# Patient Record
Sex: Female | Born: 1970 | Race: Black or African American | Hispanic: No | Marital: Single | Smoking: Never smoker
Health system: Southern US, Community
[De-identification: ages and names within clinical notes are randomized; demographics above are authoritative.]

## PROBLEM LIST (undated history)

## (undated) DIAGNOSIS — T7840XA Allergy, unspecified, initial encounter: Secondary | ICD-10-CM

## (undated) DIAGNOSIS — R0989 Other specified symptoms and signs involving the circulatory and respiratory systems: Secondary | ICD-10-CM

## (undated) DIAGNOSIS — J301 Allergic rhinitis due to pollen: Secondary | ICD-10-CM

## (undated) DIAGNOSIS — E119 Type 2 diabetes mellitus without complications: Secondary | ICD-10-CM

## (undated) HISTORY — DX: Allergic rhinitis due to pollen: J30.1

## (undated) HISTORY — DX: Allergy, unspecified, initial encounter: T78.40XA

## (undated) HISTORY — DX: Type 2 diabetes mellitus without complications: E11.9

## (undated) HISTORY — DX: Other specified symptoms and signs involving the circulatory and respiratory systems: R09.89

## (undated) HISTORY — PX: CARPAL TUNNEL WITH CUBITAL TUNNEL: SHX5608

---

## 1998-05-19 ENCOUNTER — Emergency Department (HOSPITAL_COMMUNITY): Admission: EM | Admit: 1998-05-19 | Discharge: 1998-05-19 | Payer: Self-pay | Admitting: Emergency Medicine

## 1998-06-26 ENCOUNTER — Encounter: Admission: RE | Admit: 1998-06-26 | Discharge: 1998-06-26 | Payer: Self-pay | Admitting: Family Medicine

## 1998-07-30 ENCOUNTER — Encounter: Admission: RE | Admit: 1998-07-30 | Discharge: 1998-07-30 | Payer: Self-pay | Admitting: Family Medicine

## 1998-07-30 ENCOUNTER — Other Ambulatory Visit: Admission: RE | Admit: 1998-07-30 | Discharge: 1998-07-30 | Payer: Self-pay | Admitting: Emergency Medicine

## 1998-09-03 ENCOUNTER — Encounter: Admission: RE | Admit: 1998-09-03 | Discharge: 1998-09-03 | Payer: Self-pay | Admitting: Family Medicine

## 1998-10-03 ENCOUNTER — Encounter: Admission: RE | Admit: 1998-10-03 | Discharge: 1998-10-03 | Payer: Self-pay | Admitting: Family Medicine

## 1998-12-12 ENCOUNTER — Encounter: Admission: RE | Admit: 1998-12-12 | Discharge: 1998-12-12 | Payer: Self-pay | Admitting: Sports Medicine

## 1999-01-06 ENCOUNTER — Encounter: Admission: RE | Admit: 1999-01-06 | Discharge: 1999-01-06 | Payer: Self-pay | Admitting: Sports Medicine

## 1999-02-02 ENCOUNTER — Encounter: Admission: RE | Admit: 1999-02-02 | Discharge: 1999-02-02 | Payer: Self-pay | Admitting: Family Medicine

## 1999-02-05 ENCOUNTER — Encounter: Admission: RE | Admit: 1999-02-05 | Discharge: 1999-02-05 | Payer: Self-pay | Admitting: Family Medicine

## 1999-02-05 ENCOUNTER — Ambulatory Visit (HOSPITAL_COMMUNITY): Admission: RE | Admit: 1999-02-05 | Discharge: 1999-02-05 | Payer: Self-pay | Admitting: Family Medicine

## 1999-02-19 ENCOUNTER — Encounter: Admission: RE | Admit: 1999-02-19 | Discharge: 1999-02-19 | Payer: Self-pay | Admitting: Family Medicine

## 1999-03-09 ENCOUNTER — Encounter: Admission: RE | Admit: 1999-03-09 | Discharge: 1999-03-09 | Payer: Self-pay | Admitting: Family Medicine

## 2003-02-06 ENCOUNTER — Encounter: Admission: RE | Admit: 2003-02-06 | Discharge: 2003-02-06 | Payer: Self-pay | Admitting: Family Medicine

## 2003-02-06 ENCOUNTER — Other Ambulatory Visit: Admission: RE | Admit: 2003-02-06 | Discharge: 2003-02-06 | Payer: Self-pay | Admitting: Family Medicine

## 2003-08-12 ENCOUNTER — Encounter: Admission: RE | Admit: 2003-08-12 | Discharge: 2003-08-12 | Payer: Self-pay | Admitting: Family Medicine

## 2004-03-25 ENCOUNTER — Encounter (INDEPENDENT_AMBULATORY_CARE_PROVIDER_SITE_OTHER): Payer: Self-pay | Admitting: *Deleted

## 2004-03-25 LAB — CONVERTED CEMR LAB

## 2004-04-13 ENCOUNTER — Ambulatory Visit: Payer: Self-pay | Admitting: Family Medicine

## 2004-04-24 ENCOUNTER — Other Ambulatory Visit: Admission: RE | Admit: 2004-04-24 | Discharge: 2004-04-24 | Payer: Self-pay | Admitting: Family Medicine

## 2004-04-24 ENCOUNTER — Ambulatory Visit: Payer: Self-pay | Admitting: Sports Medicine

## 2004-08-07 ENCOUNTER — Ambulatory Visit: Payer: Self-pay | Admitting: Family Medicine

## 2004-09-11 ENCOUNTER — Ambulatory Visit: Payer: Self-pay | Admitting: Family Medicine

## 2005-01-10 ENCOUNTER — Emergency Department (HOSPITAL_COMMUNITY): Admission: EM | Admit: 2005-01-10 | Discharge: 2005-01-10 | Payer: Self-pay | Admitting: Emergency Medicine

## 2005-01-12 ENCOUNTER — Ambulatory Visit: Payer: Self-pay | Admitting: Sports Medicine

## 2005-03-04 ENCOUNTER — Ambulatory Visit: Payer: Self-pay | Admitting: Family Medicine

## 2005-04-17 ENCOUNTER — Emergency Department (HOSPITAL_COMMUNITY): Admission: EM | Admit: 2005-04-17 | Discharge: 2005-04-17 | Payer: Self-pay | Admitting: Emergency Medicine

## 2005-10-06 ENCOUNTER — Encounter: Admission: RE | Admit: 2005-10-06 | Discharge: 2005-10-06 | Payer: Self-pay | Admitting: Sports Medicine

## 2005-10-06 ENCOUNTER — Other Ambulatory Visit: Admission: RE | Admit: 2005-10-06 | Discharge: 2005-10-06 | Payer: Self-pay | Admitting: Family Medicine

## 2005-10-06 ENCOUNTER — Ambulatory Visit: Payer: Self-pay | Admitting: Sports Medicine

## 2006-03-24 DIAGNOSIS — E669 Obesity, unspecified: Secondary | ICD-10-CM | POA: Insufficient documentation

## 2006-03-24 DIAGNOSIS — J4540 Moderate persistent asthma, uncomplicated: Secondary | ICD-10-CM | POA: Insufficient documentation

## 2006-03-24 DIAGNOSIS — J309 Allergic rhinitis, unspecified: Secondary | ICD-10-CM | POA: Insufficient documentation

## 2006-03-25 ENCOUNTER — Encounter (INDEPENDENT_AMBULATORY_CARE_PROVIDER_SITE_OTHER): Payer: Self-pay | Admitting: *Deleted

## 2006-07-01 ENCOUNTER — Encounter (INDEPENDENT_AMBULATORY_CARE_PROVIDER_SITE_OTHER): Payer: Self-pay | Admitting: *Deleted

## 2006-08-02 ENCOUNTER — Ambulatory Visit: Payer: Self-pay | Admitting: Family Medicine

## 2006-08-02 ENCOUNTER — Encounter: Payer: Self-pay | Admitting: Family Medicine

## 2006-08-26 ENCOUNTER — Encounter: Admission: RE | Admit: 2006-08-26 | Discharge: 2006-08-26 | Payer: Self-pay | Admitting: Sports Medicine

## 2006-08-30 ENCOUNTER — Ambulatory Visit: Payer: Self-pay | Admitting: Family Medicine

## 2006-11-20 ENCOUNTER — Emergency Department (HOSPITAL_COMMUNITY): Admission: EM | Admit: 2006-11-20 | Discharge: 2006-11-20 | Payer: Self-pay | Admitting: Emergency Medicine

## 2006-12-08 ENCOUNTER — Telehealth: Payer: Self-pay | Admitting: Family Medicine

## 2006-12-15 ENCOUNTER — Encounter: Payer: Self-pay | Admitting: *Deleted

## 2006-12-31 ENCOUNTER — Emergency Department (HOSPITAL_COMMUNITY): Admission: EM | Admit: 2006-12-31 | Discharge: 2006-12-31 | Payer: Self-pay | Admitting: Emergency Medicine

## 2007-03-28 ENCOUNTER — Encounter: Payer: Self-pay | Admitting: Family Medicine

## 2007-03-28 ENCOUNTER — Ambulatory Visit: Payer: Self-pay | Admitting: Family Medicine

## 2007-03-28 ENCOUNTER — Other Ambulatory Visit: Admission: RE | Admit: 2007-03-28 | Discharge: 2007-03-28 | Payer: Self-pay | Admitting: Family Medicine

## 2007-04-03 ENCOUNTER — Ambulatory Visit: Payer: Self-pay | Admitting: Family Medicine

## 2007-04-05 ENCOUNTER — Encounter: Payer: Self-pay | Admitting: Family Medicine

## 2007-04-06 ENCOUNTER — Ambulatory Visit: Payer: Self-pay | Admitting: Family Medicine

## 2007-05-23 ENCOUNTER — Encounter: Payer: Self-pay | Admitting: Family Medicine

## 2007-05-23 ENCOUNTER — Ambulatory Visit: Payer: Self-pay | Admitting: Family Medicine

## 2007-05-23 LAB — CONVERTED CEMR LAB
Chlamydia, DNA Probe: NEGATIVE
GC Probe Amp, Genital: NEGATIVE
Whiff Test: NEGATIVE

## 2007-05-26 ENCOUNTER — Encounter: Payer: Self-pay | Admitting: Family Medicine

## 2007-06-26 ENCOUNTER — Ambulatory Visit: Payer: Self-pay | Admitting: Family Medicine

## 2007-06-29 ENCOUNTER — Telehealth: Payer: Self-pay | Admitting: *Deleted

## 2008-04-25 ENCOUNTER — Encounter: Payer: Self-pay | Admitting: Family Medicine

## 2008-04-25 ENCOUNTER — Ambulatory Visit: Payer: Self-pay | Admitting: Family Medicine

## 2008-04-25 ENCOUNTER — Other Ambulatory Visit: Admission: RE | Admit: 2008-04-25 | Discharge: 2008-04-25 | Payer: Self-pay | Admitting: *Deleted

## 2008-04-25 DIAGNOSIS — L708 Other acne: Secondary | ICD-10-CM | POA: Insufficient documentation

## 2008-04-25 LAB — CONVERTED CEMR LAB: GC Probe Amp, Genital: NEGATIVE

## 2008-04-29 ENCOUNTER — Encounter: Payer: Self-pay | Admitting: Family Medicine

## 2008-05-22 ENCOUNTER — Encounter: Payer: Self-pay | Admitting: Family Medicine

## 2008-08-15 ENCOUNTER — Telehealth: Payer: Self-pay | Admitting: Family Medicine

## 2008-08-28 ENCOUNTER — Encounter: Payer: Self-pay | Admitting: Family Medicine

## 2008-11-01 ENCOUNTER — Ambulatory Visit: Payer: Self-pay | Admitting: Family Medicine

## 2008-11-01 ENCOUNTER — Telehealth: Payer: Self-pay | Admitting: *Deleted

## 2008-12-03 ENCOUNTER — Ambulatory Visit: Payer: Self-pay | Admitting: Family Medicine

## 2009-01-25 HISTORY — PX: CARPAL TUNNEL WITH CUBITAL TUNNEL: SHX5608

## 2009-02-17 ENCOUNTER — Telehealth (INDEPENDENT_AMBULATORY_CARE_PROVIDER_SITE_OTHER): Payer: Self-pay | Admitting: *Deleted

## 2009-02-25 ENCOUNTER — Ambulatory Visit: Payer: Self-pay | Admitting: Family Medicine

## 2009-03-26 ENCOUNTER — Ambulatory Visit: Payer: Self-pay | Admitting: Family Medicine

## 2009-03-28 ENCOUNTER — Ambulatory Visit: Payer: Self-pay | Admitting: Family Medicine

## 2009-05-20 ENCOUNTER — Other Ambulatory Visit: Admission: RE | Admit: 2009-05-20 | Discharge: 2009-05-20 | Payer: Self-pay | Admitting: Family Medicine

## 2009-05-20 ENCOUNTER — Ambulatory Visit: Payer: Self-pay | Admitting: Family Medicine

## 2009-05-20 LAB — CONVERTED CEMR LAB: H Pylori IgG: POSITIVE

## 2009-05-22 ENCOUNTER — Encounter: Payer: Self-pay | Admitting: Family Medicine

## 2009-05-29 ENCOUNTER — Encounter: Payer: Self-pay | Admitting: Family Medicine

## 2009-05-29 ENCOUNTER — Ambulatory Visit: Payer: Self-pay | Admitting: Family Medicine

## 2009-05-29 LAB — CONVERTED CEMR LAB
Albumin: 4.1 g/dL (ref 3.5–5.2)
Alkaline Phosphatase: 91 units/L (ref 39–117)
BUN: 13 mg/dL (ref 6–23)
Calcium: 9.1 mg/dL (ref 8.4–10.5)
Cholesterol: 161 mg/dL (ref 0–200)
HDL: 42 mg/dL (ref >39)
LDL Cholesterol: 107 mg/dL — ABNORMAL HIGH (ref 0–99)
Potassium: 4.5 meq/L (ref 3.5–5.3)
Sodium: 140 meq/L (ref 135–145)
Total Bilirubin: 0.7 mg/dL (ref 0.3–1.2)
Triglycerides: 58 mg/dL (ref <150)

## 2009-06-02 ENCOUNTER — Encounter: Payer: Self-pay | Admitting: Family Medicine

## 2009-07-21 ENCOUNTER — Ambulatory Visit: Payer: Self-pay | Admitting: Family Medicine

## 2009-11-05 ENCOUNTER — Encounter: Payer: Self-pay | Admitting: Family Medicine

## 2009-11-07 ENCOUNTER — Telehealth: Payer: Self-pay | Admitting: *Deleted

## 2010-01-15 ENCOUNTER — Encounter: Payer: Self-pay | Admitting: Family Medicine

## 2010-02-12 ENCOUNTER — Ambulatory Visit
Admission: RE | Admit: 2010-02-12 | Discharge: 2010-02-12 | Payer: Self-pay | Source: Home / Self Care | Attending: Family Medicine | Admitting: Family Medicine

## 2010-02-15 ENCOUNTER — Encounter: Payer: Self-pay | Admitting: Sports Medicine

## 2010-02-26 NOTE — Assessment & Plan Note (Signed)
Summary: sinus inf?,df   Vital Signs:  Patient profile:   40 year old female Height:      61.75 inches Weight:      195 pounds BMI:     36.09 BSA:     1.89 Temp:     98.2 degrees F Pulse rate:   72 / minute BP sitting:   108 / 74  Vitals Entered By: Jone Baseman CMA (February 12, 2010 1:48 PM) CC: ? sinus infection Is Patient Diabetic? No Pain Assessment Patient in pain? yes     Location: head Intensity: 4   Primary Provider:  Ardeen Garland  MD  CC:  ? sinus infection.  History of Present Illness: CC: sinus congestion.  HPI: Pt reports 2-4 week hx of sinus congestion,  purulent nasal drainage and coughing that is persistent and worse at night. She states that her symptoms are also associated with maxillary facial pain (3/10) made worse when lying supine and leaning forward. She denies fever, chills, weightloss. She denies chest pain and  SOB. She denies bothersome HA. 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 She did get her flu shot this season. She describes similar symptoms occuring around the same time each year over the past 5 years. She has had response to treatment with antibiotics in the past. She has not tried anything for symptomatic relief.  She does not smoke. Does not take anything regularly for her allergic rhinitis. Has not has to use her inhaler lately.   Contraception- pt has IUD in place. Would like refill for tretinoin.     Habits & Providers  Alcohol-Tobacco-Diet     Tobacco Status: never  Allergies: No Known Drug Allergies  Social History: Single, lives with mother and children.  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  non smoker.  Occassional alcohol.    Review of Systems       As per HPI  Physical Exam  General:  alert, well-hydrated, and appropriate dress.   Head:  Normocephalic and atraumatic without obvious abnormalities. No  apparent alopecia or balding. Ears:  External ear exam shows no significant lesions or deformities.  Otoscopic examination reveals clear canals, tympanic membranes are intact bilaterally without bulging, retraction, inflammation or discharge. Hearing is grossly normal bilaterally. Nose:  no external deformity, no external erythema, no nasal discharge, no intranasal foreign body, no nasal polyps, mucosal erythema, and mucosal edema.   Mouth:  Oral mucosa and oropharynx without lesions or exudates.  Teeth in good repair. Neck:  No deformities, masses, or tenderness noted. Lungs:  normal respiratory effort, no intercostal retractions, and normal breath sounds.   Heart:  normal rate, regular rhythm, no murmur, no gallop, and no rub.     Impression & Recommendations:  Problem # 1:  BACTERIAL  RHINITIS (ICD-460) Hx and exam findings, esp purulent nasal drainage and  maxillary sinus tenderness when leaning forward, consistent with acute bacterial rhinosinusitis. Also considered viral rhinosinusitis and allergic rhinitis, but neither is associated with purulent nasal drainage.  Plan: Amoxicillin, flonase,saline nasal spray.  RED flags reviewed.   Orders: [facility]- Est Level  3 (03474)  Complete Medication List: 1)  Tretinoin 0.025 % Crea (Tretinoin) .... Apply to face qhs  dispense: 20 g tube 2)  Ventolin Hfa 108 (90 Base) Mcg/act Aers (Albuterol sulfate) .... 2 puffs q 4 hours as needed for cough and wheeze 3)  Zofran  Odt 4 Mg Tbdp (Ondansetron) .Marland Kitchen.. 1 tab by mouth q 6hrs as needed nausea 4)  Omeprazole 20 Mg Cpdr (Omeprazole) .... Take one by mouth two times a day x 2 wks 5)  Flonase 50 Mcg/act Susp (Fluticasone propionate) .... 2 sparys per nostril daily for next 10-14 days. 6)  [facility] Saline Nasal Spray 0.65 % Soln (Saline) .Marland Kitchen.. 1 drop twice daily in each nostril for next 14 days. 7)  Amoxicillin 500 Mg Tabs (Amoxicillin) .... One tab po every 8 hours for next 14 days.  Patient Instructions: 1)  Rebecca Olson, 2)  It was a pleasure meeting you today. 3)  For your sinus infection: take and use  amoxicillin, saline nose drops and flonase for next two weeks. 4)  Your symtoms should be noticeably better w/in next week.  5)  Call and return to clinic if symptoms worsen, esp fever or sinsus pain. 6)  -Dr. Armen Pickup Prescriptions: AMOXICILLIN 500 MG TABS (AMOXICILLIN) one tab Po every 8 hours for next 14 days.  #42 tabs x 0   Entered and Authorized by:   Dessa Phi MD   Signed by:   Dessa Phi MD on 02/12/2010   Method used:   [facility]  [address] [phone][facility] (retail)       [address]       [place], [place]  [address]       Ph: [phone] or [phone]       Fax: [phone]   RxID:   [id number] [facility] SALINE NASAL SPRAY 0.65 % SOLN (SALINE) 1 drop twice daily in each nostril for next 14 days.  #1 bottle x 0   Entered and Authorized by:   Dessa Phi MD   Signed by:   Dessa Phi MD on 02/12/2010   Method used:   [facility]        [facility]  [address] [phone]* (retail)       [address]       [place], [place]  [address]       Ph: [phone] or [phone]       Fax: [phone]   RxID:   [id number] FLONASE 50 MCG/ACT SUSP (FLUTICASONE PROPIONATE) 2 sparys per nostril daily for next 10-14 days.  #1 bottle x 0   Entered and Authorized by:   Dessa Phi MD   Signed by:   Dessa Phi MD on 02/12/2010   Method used:   [facility]        [facility]  [address] [phone]* (retail)       [address]       [place], [place]  [address]       Ph: [phone] or [phone]       Fax: [phone]   RxID:   [id number]    Orders Added: 1)  [facility]- Est Level  3 [[address]]

## 2010-02-26 NOTE — Letter (Signed)
Summary: Generic Letter  [facility]  [address]   [place], [place] [address]   Phone: [phone]  Fax: [phone]    06/02/2009  Rebecca Olson [address] [place], [place]  [address]  Dear Ms. Pranger,  I am happy to inform you that all of your recent blood work was excellent.  There is no sign of diabetes.  Your kidneys, liver, and thyroid are working well.  Your cholesterol is also very good.  If you have any questions, please call our office.          Sincerely,   Ardeen Garland  MD  Appended Document: Generic Letter mailed.

## 2010-02-26 NOTE — Miscellaneous (Signed)
   Clinical Lists Changes  Problems: Changed problem from ASTHMA, UNSPECIFIED (ICD-493.90) to ASTHMA, INTERMITTENT (ICD-493.90) 

## 2010-02-26 NOTE — Assessment & Plan Note (Signed)
Summary: tb test,tcb  Nurse Visit   Allergies: No Known Drug Allergies  Immunizations Administered:  PPD Skin Test:    Vaccine Type: PPD    Site: left forearm    Mfr: Sanofi Pasteur    Dose: 0.1 ml    Route: ID    Given by: Theresia Lo RN    Exp. Date: 05/08/2011    Lot #: [id number]  Orders Added: 1)  TB Skin Test [86580] 2)  Admin 1st Vaccine [id number]

## 2010-02-26 NOTE — Progress Notes (Signed)
Summary: triage  Phone Note Call from Patient Call back at [phone]   Caller: Patient Summary of Call: Pt having stomach problems has appt on Monday for this, but until she is seen is there something she can take to soothe her stomach. Initial call taken by: Clydell Hakim,  November 07, 2009 8:41 AM  Follow-up for Phone Call        LM Follow-up by: Golden Circle RN,  November 07, 2009 9:47 AM  Additional Follow-up for Phone Call Additional follow up Details #1::        states she has an appt monday. told her she could use UC if desired. wanted to know what to take meantime. told her she can try tums or rolaids, but if it hurts that bad, see md at [facility] Additional Follow-up by: Golden Circle RN,  November 07, 2009 10:38 AM

## 2010-02-26 NOTE — Assessment & Plan Note (Signed)
Summary: tb skin test,tcb  Nurse Visit   Allergies: No Known Drug Allergies  Immunizations Administered:  PPD Skin Test:    Vaccine Type: PPD    Site: right forearm    Mfr: Sanofi Pasteur    Dose: 0.1 ml    Route: ID    Given by: Theresia Lo RN    Exp. Date: 06/22/2011    Lot #: [id number]  Orders Added: 1)  TB Skin Test [86580] 2)  Admin 1st Vaccine [phone]

## 2010-02-26 NOTE — Progress Notes (Signed)
Summary: TB Res Req  Phone Note Call from Patient Call back at [phone]   Caller: Patient Summary of Call: Would like to have her tb test faxed to [facility] fax number [phone]. Initial call taken by: Clydell Hakim,  February 17, 2009 12:25 PM  Follow-up for Phone Call        patient last PPD was 03/09 and advised that she will need to have one within the past year. she will schedule appointment . Follow-up by: Theresia Lo RN,  February 17, 2009 3:27 PM

## 2010-02-26 NOTE — Assessment & Plan Note (Signed)
Summary: cpe/pap,tcb   Vital Signs:  Patient profile:   40 year old female Height:      61.75 inches Weight:      191.6 pounds Temp:     98.5 degrees F oral Pulse rate:   80 / minute BP sitting:   110 / 76  (left arm) Cuff size:   regular  Vitals Entered By: San Morelle, SMA CC: cpe and pap smear Is Patient Diabetic? No Pain Assessment Patient in pain? no        Primary Care Provider:  Ardeen Garland  MD  CC:  cpe and pap smear.  History of Present Illness: Rebecca Olson comes in today for a full physical with pap.  SHe also complains of stomach pain. 1) CPE - no h/o abnormal pap smears.  Not currently sexually active.  Does have IUD in place.  Thinks it is causing her some lower abdominal cramping.  Has been thinking about having it removed, but not just yet.  No h/o breast cancer.  Needs lipid and DM screening. 2) STomach pain - off and on for over  year.  Epigastric.  Cramping, sometimes sharp pain.  MOstly just a "sick" or nauseated feeling.  Sometimes can't sit up straight it hurts so much. Has had to leave dinner suddenly before and this embarasses her.  STruggles with contipation with it, but no diarrhea.  Does complain of bad gas. Was on protonix but didn't think it helped so she stopped it and noticed no improvement or worsening after stopping the protonix.  Would really like to see a GI doctor.   Habits & Providers  Alcohol-Tobacco-Diet     Tobacco Status: never  Current Medications (verified): 1)  Tretinoin 0.025 % Crea (Tretinoin) .... Apply To Face Qhs  Dispense: 20 G Tube 2)  Diclofenac Sodium 75 Mg Tbec (Diclofenac Sodium) .... Two Times A Day As Needed Pain 3)  Ventolin Hfa 108 (90 Base) Mcg/act Aers (Albuterol Sulfate) .... 2 Puffs Q 4 Hours As Needed For Cough and Wheeze 4)  Zofran Odt 4 Mg Tbdp (Ondansetron) .Marland Kitchen.. 1 Tab By Mouth Q 6hrs As Needed Nausea  Allergies: No Known Drug Allergies  Past History:  Past Medical History: Last updated: 03/24/2006 H pylori  (+) 9/07, symptomatic, clarithromycin Rx, phentermine 37.5mg  daily By Bariatric clinic), Vitamin B12 shots monsthly  Past Surgical History: Last updated: 08/02/2006 non smoker.  Occassional alcohol.    Family History: Last updated: 04/25/2008 Brother  healthy., Father  healthy., MGM with HTN, DM., Mother alive at 16 with HTN, asthma, OVARIAN CYST.  Social History: Last updated: 03/24/2006 Single, lives with mother and children.  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  Physical Exam  General:  obese, alert, NAD vitals reviewed Head:  normocephalic, atraumatic, no abnormalities observed, and no abnormalities palpated.   Eyes:  pupils equal, pupils round, corneas and lenses clear, and no injection.   Ears:  R ear normal and L ear normal.   Nose:  External nasal examination shows no deformity or inflammation. Nasal mucosa are pink and moist without lesions or exudates. Mouth:  Oral mucosa and oropharynx without lesions or exudates.  Teeth in good repair. Neck:  No deformities, masses, or tenderness noted. Breasts:  No mass, nodules, thickening, tenderness, bulging, retraction, inflamation, nipple discharge or skin changes noted.   Lungs:  Normal respiratory effort, chest expands symmetrically. Lungs are clear to auscultation, no crackles or wheezes. Heart:  Normal rate and regular rhythm. S1 and S2 normal without gallop, murmur, click, rub or other extra sounds. Abdomen:  obese, soft, nondistended, mildly TTP diffusely, no rebound or guarding Genitalia:  normal introitus, no external lesions, no vaginal discharge, mucosa pink and moist, no vaginal or cervical lesions, no vaginal atrophy, no friaility or hemorrhage, and normal uterus size and position.  unable to palpate adnexae secondary to body habitus Pulses:  2+ radial and dp pulses Extremities:  no edema Skin:  Intact without suspicious lesions or rashes Cervical  Nodes:  No lymphadenopathy noted Axillary Nodes:  No palpable lymphadenopathy Psych:  Oriented X3, memory intact for recent and remote, normally interactive, good eye contact, not anxious appearing, and not depressed appearing.     Impression & Recommendations:  Problem # 1:  ROUTINE GYNECOLOGICAL EXAMINATION (ICD-V72.31) Assessment New  Pap obtained.  SHe will return for fasting labwork.    Orders: [facility] - Est  18-39 yrs ([address])  Problem # 2:  EPIGASTRIC PAIN (ICD-789.06) Tested for H.Pylori. Positive.  Will treat with prevpak.  If not improved, will refer to GI per patient request.  Orders: H pylori-[facility] ([address])  Complete Medication List: 1)  Tretinoin 0.025 % Crea (Tretinoin) .... Apply to face qhs  dispense: 20 g tube 2)  Diclofenac Sodium 75 Mg Tbec (Diclofenac sodium) .... Two times a day as needed pain 3)  Ventolin Hfa 108 (90 Base) Mcg/act Aers (Albuterol sulfate) .... 2 puffs q 4 hours as needed for cough and wheeze 4)  Zofran Odt 4 Mg Tbdp (Ondansetron) .Marland Kitchen.. 1 tab by mouth q 6hrs as needed nausea 5)  Prevpac Misc (Amoxicill-clarithro-lansopraz) .... Take as directed  Other Orders: Pap Smear-[facility] ([id number]) Future Orders: Comp Met-[facility] ([id number]) ... 06/24/2009 Lipid-[facility] ([id number]) ... 06/24/2009 TSH-[facility] [id number]) ... 06/24/2009  Patient Instructions: 1)  Please return in the morning before you eat or drink anything (other than water) to have your labwork drawn.  Please call the day before you are coming so they can put you on the lab schedule.  We open at 8:30AM.  2)  I will mail you the results of your pap smear.  3)  You did test positive for H. PYLORI (HELICOBACTER PYLORI).  It is a bacteria that can live in the stomach and cause chronic inflammation and irritation.  Complete the full course of the medicines to get rid of it. 4)  If you feel no better after completing the treatment, then call back and I will put in a referral to see the GI doctors.    Prescriptions: PREVPAC  MISC (AMOXICILL-CLARITHRO-LANSOPRAZ) take as directed  #1 x 0   Entered and Authorized by:   Ardeen Garland  MD   Signed by:   Ardeen Garland  MD on 05/20/2009   Method used:   Print then Give to Patient   RxID:   [id number] ZOFRAN ODT 4 MG TBDP (ONDANSETRON) 1 tab by mouth q 6hrs as needed nausea  #21 x 2   Entered and Authorized by:   Ardeen Garland  MD   Signed by:   Ardeen Garland  MD on 05/20/2009   Method used:   Print then Give to Patient   RxID:   [id number]    Laboratory Results   Blood Tests   Date/Time Received: Simya 26, 2011 4:37 PM  Date/Time Reported: Zara 26, 2011 4:50 PM    H. pylori: positive Comments: ...........test performed by...........Marland KitchenTerese Door, CMA

## 2010-02-26 NOTE — Assessment & Plan Note (Signed)
Summary: stomach pains,tcb   Vital Signs:  Patient profile:   40 year old female Weight:      193.4 pounds BMI:     35.79 Temp:     98.0 degrees F oral Pulse rate:   72 / minute BP sitting:   114 / 80  (right arm) Cuff size:   regular  Vitals Entered By: Jimmy Footman, CMA (July 21, 2009 9:40 AM) CC: Stomach pain  Is Patient Diabetic? No Pain Assessment Patient in pain? yes     Location: abdomen Intensity: 6 Type: aching   Primary Care Provider:  Ardeen Garland  MD  CC:  Stomach pain .  History of Present Illness: CC: stomach pain  several year h/o stomach pains, at times so severe that can't drive.  Eval by PCP with dx GERD then H pylori, first treated with atnacids then last month with prevpack for H pylori.  Didn't note improvement on prevpak.    Feels "sick all over" - nausea, then diaphoretic.  Trouble standing up with pain.  Pain more on left upper quadrant/epigastric.  No vomiting.  + constipation, no diarrhea.  + blood in stool 04/2009 (slimy, noted when wiping).  no h/o hemorrhoids.  + gaining weight, no weight loss.  No fm hx stomach issues.  + GMA with throat cancers.  No colon cancers.    Upon further discussion, pt does have irregular bowel habits, using enemas to have BMs, only uses bathroom at home or at [employer] house (not public restrooms) which leads her to have to hold stool for long periods of time.  Does not eat much fiber.  Habits & Providers  Alcohol-Tobacco-Diet     Tobacco Status: never  Current Medications (verified): 1)  Tretinoin 0.025 % Crea (Tretinoin) .... Apply To Face Qhs  Dispense: 20 G Tube 2)  Ventolin Hfa 108 (90 Base) Mcg/act Aers (Albuterol Sulfate) .... 2 Puffs Q 4 Hours As Needed For Cough and Wheeze 3)  Zofran Odt 4 Mg Tbdp (Ondansetron) .Marland Kitchen.. 1 Tab By Mouth Q 6hrs As Needed Nausea  Allergies (verified): No Known Drug Allergies   Past History:  Past medical, surgical, family and social histories (including risk factors)  reviewed for relevance to current acute and chronic problems.  Past Medical History: H pylori (+) 9/07, symptomatic, clarithromycin Rx, phentermine 37.5mg  daily By Bariatric clinic), Vitamin B12 shots monsthly H pylori (+) 2011 - treated with prevpack with no improvement  Past Surgical History: none  Family History: Reviewed history from 04/25/2008 and no changes required. Brother  healthy., Father  healthy., MGM with HTN, DM., Mother alive at 31 with HTN, asthma, OVARIAN CYST.  Social History: Reviewed history from 03/24/2006 and no changes required. Single, lives with mother and children.  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  non smoker.  Occassional alcohol.    Review of Systems       per HPI  Physical Exam  General:  obese, alert, NAD vitals reviewed Lungs:  Normal respiratory effort, chest expands symmetrically. Lungs are clear to auscultation, no crackles or wheezes. Heart:  Normal rate and regular rhythm. S1 and S2 normal without gallop, murmur, click, rub or other extra sounds. Abdomen:  Bowel sounds positive,abdomen soft and without masses, organomegaly or hernias noted.  obese, tender to palpation epigastrically and LUQ.  mild tenderness lower quadrants.  no rebound, guarding, masses appreciated. Extremities:  no edema   Impression & Recommendations:  Problem #  1:  ABDOMINAL PAIN RIGHT UPPER QUADRANT (ICD-789.01) recent workup including CMP, TSH, FLP WNL last month.  Discussed likely dx were GERD vs constipation with bloating/indigestion.  No concerning sxs of weight loss, h/o cancer, bowel movement changes (chronically constipated).  Discussed more healthy bowel routine (not holding onto bowel but going when needed, increasing fiber with water (handout provided for goal of 25gm fiber daily), stopping enemas.  Trial of this for a few weeks, also started on omeprazole daily for 2 wks.  If no noted  improvement, to return.  Also offered referral to GI (but pt with medicaid) and patient prefers to wait try this first.  No EtOH.  Asked to return in 2-4 wks.  If continued complaint, would check lipase, stool cards, and refer to GI.  Orders: [facility]- Est  Level 4 ([address])  Complete Medication List: 1)  Tretinoin 0.025 % Crea (Tretinoin) .... Apply to face qhs  dispense: 20 g tube 2)  Ventolin Hfa 108 (90 Base) Mcg/act Aers (Albuterol sulfate) .... 2 puffs q 4 hours as needed for cough and wheeze 3)  Zofran Odt 4 Mg Tbdp (Ondansetron) .Marland Kitchen.. 1 tab by mouth q 6hrs as needed nausea 4)  Omeprazole 20 Mg Cpdr (Omeprazole) .... Take one by mouth two times a day x 2 wks  Patient Instructions: 1)  Start omeprazole daily for reflux to see if this will help. 2)  Start increasing amount of fiber in diet with lots of water, slowly.   3)  Fiber handout provided.  Also you could try fiber supplements (benefiber, metamucil). 4)  Return in 2-4 wks after implementing these changes to see how you're doing. 5)  Good to see you today! Prescriptions: OMEPRAZOLE 20 MG CPDR (OMEPRAZOLE) take one by mouth two times a day x 2 wks  #28 x 0   Entered and Authorized by:   Eustaquio Boyden  MD   Signed by:   Eustaquio Boyden  MD on 07/21/2009   Method used:   [facility]        [facility]  [address] [phone]* (retail)       [address]       [place], [place]  [id number]       Ph: [phone] or [phone]       Fax: [phone]   RxID:   [id number]

## 2010-02-26 NOTE — Miscellaneous (Signed)
  Clinical Lists Changes  Problems: Removed problem of ABDOMINAL PAIN RIGHT UPPER QUADRANT (ICD-789.01) Removed problem of SINUSITIS, ACUTE (ICD-461.9) Removed problem of KNEE PAIN, RIGHT (ICD-719.46) Removed problem of ROUTINE GYNECOLOGICAL EXAMINATION (ICD-V72.31) Removed problem of CERUMEN IMPACTION, RIGHT (ICD-380.4) Removed problem of CONTACT OR EXPOSURE TO OTHER VIRAL DISEASES (ICD-V01.79) Removed problem of SCREENING FOR MALIGNANT NEOPLASM OF THE CERVIX (ICD-V76.2)

## 2010-02-26 NOTE — Assessment & Plan Note (Signed)
Summary: read tb/eo  Nurse Visit   Allergies: No Known Drug Allergies  PPD Results    Date of reading: 03/28/2009    Results: 0 mm    Interpretation: negative  Orders Added: 1)  No Charge Patient Arrived (NCPA0) [NCPA0]

## 2010-02-26 NOTE — Letter (Signed)
Summary: Generic Letter  [facility]  [address]   [place], [place] [address]   Phone: [phone]  Fax: [phone]    05/22/2009  Rebecca Olson [address] [place], [place]  [address]  Dear Ms. Legner,  I am happy to inform you that your pap smear was normal.  If you have any questions, please call our office.          Sincerely,   Ardeen Garland  MD  Appended Document: Generic Letter mailed.

## 2010-03-18 ENCOUNTER — Encounter: Payer: Self-pay | Admitting: *Deleted

## 2010-03-21 ENCOUNTER — Other Ambulatory Visit: Payer: Self-pay | Admitting: Family Medicine

## 2010-07-14 ENCOUNTER — Ambulatory Visit (INDEPENDENT_AMBULATORY_CARE_PROVIDER_SITE_OTHER): Payer: Self-pay | Admitting: Family Medicine

## 2010-07-14 ENCOUNTER — Encounter: Payer: Self-pay | Admitting: Family Medicine

## 2010-07-14 VITALS — BP 110/74 | HR 82 | Temp 98.7°F | Ht 61.75 in | Wt 203.0 lb

## 2010-07-14 DIAGNOSIS — R1033 Periumbilical pain: Secondary | ICD-10-CM | POA: Insufficient documentation

## 2010-07-14 DIAGNOSIS — R1032 Left lower quadrant pain: Secondary | ICD-10-CM

## 2010-07-14 LAB — COMPREHENSIVE METABOLIC PANEL
ALT: 16 U/L (ref 0–35)
AST: 17 U/L (ref 0–37)
Albumin: 4.3 g/dL (ref 3.5–5.2)
Calcium: 9.4 mg/dL (ref 8.4–10.5)
Glucose, Bld: 78 mg/dL (ref 70–99)
Total Protein: 7.3 g/dL (ref 6.0–8.3)

## 2010-07-14 MED ORDER — OMEPRAZOLE 20 MG PO CPDR: 20.0000 mg | DELAYED_RELEASE_CAPSULE | Freq: Two times a day (BID) | ORAL | Status: DC

## 2010-07-14 MED ORDER — POLYETHYLENE GLYCOL 3350 17 GM/SCOOP PO POWD: 17.0000 g | Freq: Every day | ORAL | Status: AC

## 2010-07-14 NOTE — Assessment & Plan Note (Signed)
Chronic. No red flags today. Suspect chronic constipation. Rx Miralax, focus on fiber, drinking lots of water, exercise. Goal is for daily to qod BM. Also, Rx Prilosec based on possible GERD.gastritis component and TTP on exam. Precautions given. Patient to call after 1 week of treatment. If not improving, will obtain Abd XRay. Also, gave patient option of GI referral based on chronic nature of this issue and possible need for scope/further GI work-up. Patient agreed with plan of treatment first. DDx: Unlikely pregnancy, will check UPREG. Does not seem related to gallbladder, pancreas, GU, ovaries.

## 2010-07-14 NOTE — Progress Notes (Signed)
  Subjective:    Patient ID: Rebecca Olson, female    DOB: [birth year], 40 y.o.   MRN: [id number]  HPI  1. Abdominal Pain: x several years, intermittent, pain usually every other week, located in LUQ/LLQ, described as crampy and sharp, made better with BM, seems to be worse at night. Associated with nausea and a felling of "heating up inside." No CP, SOB, vomiting, BRBPR, hemorrhoids, melena. BM every 2-3 days, sometimes requiring enema, described as hard with no mucus. Irregular menses with Mirena in place, LMP 2 months ago, sexually active, no vaginal DC, no dysuria or hematuria. Previous Dx of GERD, H. Pylori +, now s/p treatment. States that pain did not improve after treatment. Pain did seem to improve when she was focusing on eating more fiber. No FamHx of colon cancer or bowel diseases.   Review of Systems SEE HPI.    Objective:   Physical Exam  Vitals reviewed. Constitutional: She appears well-developed and well-nourished. No distress.  Cardiovascular: Normal rate and regular rhythm.   Pulmonary/Chest: Effort normal.  Abdominal: Soft.       BS slightly hypoactive. Moderate TTP along epigastric, LUQ - more pronounced TTP along LLQ. Stool palpated in colon. ND.      Assessment & Plan:

## 2010-07-14 NOTE — Patient Instructions (Signed)

## 2010-10-13 ENCOUNTER — Ambulatory Visit (INDEPENDENT_AMBULATORY_CARE_PROVIDER_SITE_OTHER): Payer: Self-pay | Admitting: Family Medicine

## 2010-10-13 VITALS — BP 117/82 | HR 97 | Temp 98.5°F | Wt 193.0 lb

## 2010-10-13 DIAGNOSIS — R21 Rash and other nonspecific skin eruption: Secondary | ICD-10-CM | POA: Insufficient documentation

## 2010-10-13 MED ORDER — DOXYCYCLINE HYCLATE 100 MG PO CAPS: 100.0000 mg | ORAL_CAPSULE | Freq: Two times a day (BID) | ORAL | Status: AC

## 2010-10-13 MED ORDER — FLUCONAZOLE 150 MG PO TABS: 150.0000 mg | ORAL_TABLET | Freq: Once | ORAL | Status: AC

## 2010-10-13 MED ORDER — MUPIROCIN 2 % EX OINT: TOPICAL_OINTMENT | Freq: Three times a day (TID) | CUTANEOUS | Status: DC

## 2010-10-13 NOTE — Progress Notes (Signed)
  Subjective:    Patient ID: Rebecca Olson, female    DOB: 12-01-1970, 40 y.o.   MRN: [id number]  HPI 4 weeks of rash.  Itchiness under breasts with vesicles.  Also spread to left hand, and now has one vesicle on right breast.  Has not used any treatment except for gold bond powder.  No fever, chills, nausea, vomiting, abd pain or other symptom.  Feels at her baseline health.    Review of Systems See hpi    Objective:   Physical Exam GEN: NAD Skin: evidence of what could be old rupture vesicles and erythema under breasts Left > right.  1 cm diameter area of papules on left palm which are tender 3 mm flaccid pustula bullae on right breast on an erythematous  base       Assessment & Plan:

## 2010-10-13 NOTE — Assessment & Plan Note (Signed)
Discussed with Dr. Leveda Anna.  Unclear diagnosis.  Will treat with doxycyline to cover mrsa.  Also given Bactroban for pustule on right breast.  Ave diflucan for possible candida under breast.  Asked her to follow-up if no improvement.  Would consider punch biopsy vs referral to derm.

## 2010-10-13 NOTE — Patient Instructions (Signed)
Follow-up in 2-3 weeks if no improvement Use doxycycline twice a day for 7 days (antibiotic) Use mupirocin cream for open lesions Benadryl or zyrtec for itching

## 2011-07-05 ENCOUNTER — Encounter: Payer: Self-pay | Admitting: Family Medicine

## 2011-07-27 ENCOUNTER — Encounter: Payer: Self-pay | Admitting: Family Medicine

## 2011-12-18 ENCOUNTER — Emergency Department (HOSPITAL_COMMUNITY): Payer: Self-pay

## 2011-12-18 ENCOUNTER — Encounter (HOSPITAL_COMMUNITY): Payer: Self-pay | Admitting: *Deleted

## 2011-12-18 ENCOUNTER — Emergency Department (HOSPITAL_COMMUNITY)
Admission: EM | Admit: 2011-12-18 | Discharge: 2011-12-18 | Disposition: A | Discharge status: Home / Self Care | Payer: Self-pay | Attending: Emergency Medicine | Admitting: Emergency Medicine

## 2011-12-18 DIAGNOSIS — S0990XA Unspecified injury of head, initial encounter: Secondary | ICD-10-CM | POA: Insufficient documentation

## 2011-12-18 DIAGNOSIS — S199XXA Unspecified injury of neck, initial encounter: Secondary | ICD-10-CM | POA: Insufficient documentation

## 2011-12-18 DIAGNOSIS — J45909 Unspecified asthma, uncomplicated: Secondary | ICD-10-CM | POA: Insufficient documentation

## 2011-12-18 DIAGNOSIS — S39012A Strain of muscle, fascia and tendon of lower back, initial encounter: Secondary | ICD-10-CM

## 2011-12-18 DIAGNOSIS — S161XXA Strain of muscle, fascia and tendon at neck level, initial encounter: Secondary | ICD-10-CM

## 2011-12-18 DIAGNOSIS — Y92410 Unspecified street and highway as the place of occurrence of the external cause: Secondary | ICD-10-CM | POA: Insufficient documentation

## 2011-12-18 DIAGNOSIS — S139XXA Sprain of joints and ligaments of unspecified parts of neck, initial encounter: Secondary | ICD-10-CM | POA: Insufficient documentation

## 2011-12-18 DIAGNOSIS — S335XXA Sprain of ligaments of lumbar spine, initial encounter: Secondary | ICD-10-CM | POA: Insufficient documentation

## 2011-12-18 DIAGNOSIS — S0993XA Unspecified injury of face, initial encounter: Secondary | ICD-10-CM | POA: Insufficient documentation

## 2011-12-18 DIAGNOSIS — Y93I9 Activity, other involving external motion: Secondary | ICD-10-CM | POA: Insufficient documentation

## 2011-12-18 MED ORDER — CYCLOBENZAPRINE HCL 10 MG PO TABS: 10.0000 mg | ORAL_TABLET | Freq: Three times a day (TID) | ORAL | Status: DC | PRN

## 2011-12-18 MED ORDER — IBUPROFEN 800 MG PO TABS: 800.0000 mg | ORAL_TABLET | Freq: Three times a day (TID) | ORAL | Status: DC | PRN

## 2011-12-18 MED ORDER — OXYCODONE-ACETAMINOPHEN 5-325 MG PO TABS: 1.0000 | ORAL_TABLET | Freq: Four times a day (QID) | ORAL | Status: DC | PRN

## 2011-12-18 MED ORDER — KETOROLAC TROMETHAMINE 60 MG/2ML IM SOLN
60.0000 mg | Freq: Once | INTRAMUSCULAR | Status: AC
  Administered 2011-12-18: 60 mg via INTRAMUSCULAR
  Filled 2011-12-18: qty 2
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
## 2011-12-18 NOTE — ED Provider Notes (Signed)
Medical screening examination/treatment/procedure(s) were performed by non-physician practitioner and as supervising physician I was immediately available for consultation/collaboration.  Donnetta Hutching, MD 12/18/11 [phone]
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
## 2012-03-20 ENCOUNTER — Encounter: Payer: Self-pay | Admitting: Family Medicine

## 2012-03-20 ENCOUNTER — Ambulatory Visit (INDEPENDENT_AMBULATORY_CARE_PROVIDER_SITE_OTHER): Payer: Managed Care, Other (non HMO) | Admitting: Family Medicine

## 2012-03-20 ENCOUNTER — Other Ambulatory Visit (HOSPITAL_COMMUNITY)
Admission: RE | Admit: 2012-03-20 | Discharge: 2012-03-20 | Disposition: A | Discharge status: Home / Self Care | Payer: Medicaid Other | Source: Ambulatory Visit | Attending: Family Medicine | Admitting: Family Medicine

## 2012-03-20 VITALS — BP 113/78 | HR 78 | Temp 98.7°F | Ht 62.5 in | Wt 193.0 lb

## 2012-03-20 DIAGNOSIS — Z833 Family history of diabetes mellitus: Secondary | ICD-10-CM

## 2012-03-20 DIAGNOSIS — Z124 Encounter for screening for malignant neoplasm of cervix: Secondary | ICD-10-CM

## 2012-03-20 DIAGNOSIS — N898 Other specified noninflammatory disorders of vagina: Secondary | ICD-10-CM

## 2012-03-20 DIAGNOSIS — J45909 Unspecified asthma, uncomplicated: Secondary | ICD-10-CM

## 2012-03-20 DIAGNOSIS — Z113 Encounter for screening for infections with a predominantly sexual mode of transmission: Secondary | ICD-10-CM | POA: Insufficient documentation

## 2012-03-20 DIAGNOSIS — E669 Obesity, unspecified: Secondary | ICD-10-CM

## 2012-03-20 DIAGNOSIS — Z01419 Encounter for gynecological examination (general) (routine) without abnormal findings: Secondary | ICD-10-CM | POA: Insufficient documentation

## 2012-03-20 DIAGNOSIS — Z0000 Encounter for general adult medical examination without abnormal findings: Secondary | ICD-10-CM

## 2012-03-20 DIAGNOSIS — Z1151 Encounter for screening for human papillomavirus (HPV): Secondary | ICD-10-CM | POA: Insufficient documentation

## 2012-03-20 LAB — POCT WET PREP (WET MOUNT): Clue Cells Wet Prep Whiff POC: NEGATIVE

## 2012-03-20 NOTE — Patient Instructions (Addendum)
Make lab appointment for fasting labwork to check for diabetes and cholesterol.  Nothing but water for 8 hours.  If you and your husband decide that you want to grow your family, start taking a prenatal vitamin every day  Work on healthy weight and exercise to improve your health for yourself and future pregnancy  Schedule follow-up to take out Mirena when you are ready  See info for scheduling your mammogram

## 2012-03-20 NOTE — Progress Notes (Signed)
  Subjective:    Patient ID: Rebecca Olson, female    DOB: Apr 12, 1970, 42 y.o.   MRN: [id number]  HPI  Annual Gynecological Exam  G4P2A2 Wt Readings from Last 3 Encounters:  03/20/12 193 lb (87.544 kg)  10/13/10 193 lb (87.544 kg)  07/14/10 203 lb (92.08 kg)   Last period:  irregular Regular periods: no  Heavy bleeding: no  Sexually active: yes-husband is deployed- returning in 1 month Birth control or hormonal therapy:mirena Hx of STD: Patient desires STD screening Dyspareunia: No Hot flashes: No Vaginal discharge:No Dysuria:No   Last mammogram: No Breast mass or concerns: No Last Pap: 2011  History of abnormal pap: No  FH of breast, uterine, ovarian, colon cancer: No   Thinking about having another child when her husband returns from deployment.  Also mentioned some intermittent pelvic pain, irregular periods, but not heavy Review of Systems See HPI  Patient Information Form: Screening and ROS  AUDIT-C Score: 0 Do you feel safe in relationships? yes PHQ-2:negative  Review of Symptoms  General:  Negative for nexplained weight loss, fever Skin: Negative for new or changing mole, sore that won't heal HEENT: Negative for trouble hearing, trouble seeing, ringing in ears, mouth sores, hoarseness, change in voice, dysphagia. CV:  Negative for chest pain, dyspnea, edema, palpitations Resp: Negative for cough, dyspnea, hemoptysis GI: Negative for nausea, vomiting, diarrhea, constipation, abdominal pain, melena, hematochezia. GU: Negative for dysuria, incontinence, urinary hesitance, hematuria, vaginal or penile discharge, polyuria, sexual difficulty, lumps in testicle or breasts MSK: Negative for muscle cramps or aches, joint pain or swelling Neuro: Negative for headaches, weakness, numbness, dizziness, passing out/fainting Psych: Negative for depression, anxiety, memory problems      Objective:   Physical Exam GEN: Alert & Oriented, No acute distress HEENT: [place]/AT.  EOMI, PERRLA, no conjunctival injection or scleral icterus.  Bilateral tympanic membranes intact without erythema or effusion.  .  Nares without edema or rhinorrhea.  Oropharynx is without erythema or exudates.  No anterior or posterior cervical lymphadenopathy. CV:  Regular Rate & Rhythm, no murmur Respiratory:  Normal work of breathing, CTAB Abd:  + BS, soft, no tenderness to palpation Ext: no pre-tibial edema Pelvic Exam:        External: normal female genitalia without lesions or masses        Vagina: normal without lesions or masses        Cervix: normal without lesions or masses- Mirena strings seen exiting from os        Adnexa: normal bimanual exam without masses or fullness        Uterus: normal by palpation        Pap smear: performed        Samples for Wet prep, GC/Chlamydia obtained         Breast:  wnl        Assessment & Plan:  Screening UTD with pap today.   Gave info to schedule mammogram.  Will return for fasting labs

## 2012-03-22 ENCOUNTER — Other Ambulatory Visit: Payer: Medicaid Other

## 2012-03-22 DIAGNOSIS — Z833 Family history of diabetes mellitus: Secondary | ICD-10-CM

## 2012-03-22 DIAGNOSIS — E669 Obesity, unspecified: Secondary | ICD-10-CM

## 2012-03-22 LAB — COMPREHENSIVE METABOLIC PANEL
Albumin: 4.3 g/dL (ref 3.5–5.2)
Alkaline Phosphatase: 82 U/L (ref 39–117)
BUN: 12 mg/dL (ref 6–23)
CO2: 26 mEq/L (ref 19–32)
Glucose, Bld: 90 mg/dL (ref 70–99)
Potassium: 4.2 mEq/L (ref 3.5–5.3)
Total Bilirubin: 0.7 mg/dL (ref 0.3–1.2)

## 2012-03-22 LAB — LIPID PANEL
Cholesterol: 170 mg/dL (ref 0–200)
Triglycerides: 75 mg/dL (ref <150)
VLDL: 15 mg/dL (ref 0–40)

## 2012-03-22 LAB — CBC
Hemoglobin: 11.7 g/dL — ABNORMAL LOW (ref 12.0–15.0)
MCH: 23.4 pg — ABNORMAL LOW (ref 26.0–34.0)
MCHC: 33.4 g/dL (ref 30.0–36.0)
MCV: 70.1 fL — ABNORMAL LOW (ref 78.0–100.0)
RBC: 4.99 MIL/uL (ref 3.87–5.11)

## 2012-03-22 LAB — TSH: TSH: 1.3 u[IU]/mL (ref 0.350–4.500)

## 2012-03-22 NOTE — Progress Notes (Signed)
CMP,CBC,FLP AND TSH Aarav Burgett

## 2012-03-23 ENCOUNTER — Encounter: Payer: Self-pay | Admitting: Family Medicine

## 2012-07-06 ENCOUNTER — Ambulatory Visit: Payer: Medicaid Other | Admitting: Family Medicine

## 2012-09-18 ENCOUNTER — Ambulatory Visit: Payer: Medicaid Other | Admitting: Family Medicine

## 2012-09-21 ENCOUNTER — Encounter: Payer: Self-pay | Admitting: Family Medicine

## 2012-09-21 ENCOUNTER — Ambulatory Visit (INDEPENDENT_AMBULATORY_CARE_PROVIDER_SITE_OTHER): Payer: Managed Care, Other (non HMO) | Admitting: Family Medicine

## 2012-09-21 VITALS — BP 114/79 | HR 80 | Temp 98.2°F | Ht 61.5 in | Wt 210.6 lb

## 2012-09-21 DIAGNOSIS — Z975 Presence of (intrauterine) contraceptive device: Secondary | ICD-10-CM

## 2012-09-21 DIAGNOSIS — R1033 Periumbilical pain: Secondary | ICD-10-CM

## 2012-09-21 LAB — POCT URINE PREGNANCY: Preg Test, Ur: NEGATIVE

## 2012-09-21 MED ORDER — OMEPRAZOLE 20 MG PO CPDR: 20.0000 mg | DELAYED_RELEASE_CAPSULE | Freq: Every day | ORAL | Status: DC

## 2012-09-21 MED ORDER — ALBUTEROL SULFATE HFA 108 (90 BASE) MCG/ACT IN AERS: 2.0000 | INHALATION_SPRAY | RESPIRATORY_TRACT | Status: DC | PRN

## 2012-09-21 NOTE — Patient Instructions (Addendum)
Ms. Titsworth,  Thank you for coming in today. Please start Prilosec to help with reflux symptoms. Given the chronic nature of your pain adn my concern for IBS I have placed a referral to GI.  You will be contacted with appointment details.   Checking: U preg, CMP and CBC today.  Dr. Armen Pickup

## 2012-09-21 NOTE — Progress Notes (Signed)
Subjective:     Patient ID: Rebecca Olson, female   DOB: Mar 09, 1970, 42 y.o.   MRN: [id number]  HPI 42 yo African-American F presents for f/u visit to discuss the following:  1. Abdominal pain: x 8 years. Intermittent most days of the week. Moderate to severe. Usually LLQ or LUQ. Most recently periumbilical, sharp pains radiates to epigastric area, last minutes, associated with nausea and burping. She denies dysphagia, odnynophagia, vomiting, back pain, weight loss, fever or chills. She has history of H. Pylori that was treated, pain persisted. She has history of GERD symptoms that was treated pain persisted. She has history of intermittent constipation that she treats with enemas and miralax, pain persist. Her current bowel habits are soft stools every other day. Stools are brown. She intermittent sees bright red blood on stools, last saw one week ago. No family history of IBS, IBD or colon cancer. She is a non-smoker. She drinks alcohol once weekly, one  glass of wine.   2. Health maintenance: overdue for mammogram.   3. Contraception: has mirena in place. Unsure of how long. Placed in [facility]. Having monthly periods for the past 3 months. LMP 09/17/12.  Review of Systems As per HPI    Objective:   Physical Exam BP 114/79  Pulse 80  Temp(Src) 98.2 F (36.8 C) (Oral)  Ht 5' 1.5" (1.562 m)  Wt 210 lb 9.6 oz (95.528 kg)  BMI 39.15 kg/m2  LMP 09/17/2012 Wt Readings from Last 3 Encounters:  09/21/12 210 lb 9.6 oz (95.528 kg)  03/20/12 193 lb (87.544 kg)  10/13/10 193 lb (87.544 kg)  General appearance: alert, cooperative and no distress Throat: lips, mucosa, and tongue normal; teeth and gums normal Abdomen: soft, hypoactive bowel sounds, mild TTP in periumbilical area, no rebound. Negative Murphy sign.  Rectal: skin tag at 6 o'clock, no lesions, normal rectal tone. Soft brown stool in rectal vault.  Hemoccult negative.  U preg: negative.  CBC    Component Value Date/Time   WBC 10.4  09/21/2012 1539   RBC 5.27* 09/21/2012 1539   HGB 12.4 09/21/2012 1539   HCT 36.7 09/21/2012 1539   PLT 334 09/21/2012 1539   MCV 69.6* 09/21/2012 1539   MCH 23.5* 09/21/2012 1539   MCHC 33.8 09/21/2012 1539   RDW 16.7* 09/21/2012 1539      Chemistry      Component Value Date/Time   NA 138 09/21/2012 1539   K 4.1 09/21/2012 1539   CL 104 09/21/2012 1539   CO2 27 09/21/2012 1539   BUN 13 09/21/2012 1539   CREATININE 0.90 09/21/2012 1539      Component Value Date/Time   CALCIUM 9.3 09/21/2012 1539   ALKPHOS 95 09/21/2012 1539   AST 15 09/21/2012 1539   ALT 15 09/21/2012 1539   BILITOT 0.5 09/21/2012 1539         Assessment and Plan:

## 2012-09-22 ENCOUNTER — Telehealth: Payer: Self-pay | Admitting: Family Medicine

## 2012-09-22 DIAGNOSIS — Z975 Presence of (intrauterine) contraceptive device: Secondary | ICD-10-CM | POA: Insufficient documentation

## 2012-09-22 LAB — CBC
HCT: 36.7 % (ref 36.0–46.0)
Hemoglobin: 12.4 g/dL (ref 12.0–15.0)
MCH: 23.5 pg — ABNORMAL LOW (ref 26.0–34.0)
MCHC: 33.8 g/dL (ref 30.0–36.0)
MCV: 69.6 fL — ABNORMAL LOW (ref 78.0–100.0)
Platelets: 334 10*3/uL (ref 150–400)
RBC: 5.27 MIL/uL — ABNORMAL HIGH (ref 3.87–5.11)
RDW: 16.7 % — ABNORMAL HIGH (ref 11.5–15.5)
WBC: 10.4 10*3/uL (ref 4.0–10.5)

## 2012-09-22 LAB — COMPREHENSIVE METABOLIC PANEL
BUN: 13 mg/dL (ref 6–23)
CO2: 27 mEq/L (ref 19–32)
Calcium: 9.3 mg/dL (ref 8.4–10.5)
Chloride: 104 mEq/L (ref 96–112)
Creat: 0.9 mg/dL (ref 0.50–1.10)
Glucose, Bld: 82 mg/dL (ref 70–99)
Total Bilirubin: 0.5 mg/dL (ref 0.3–1.2)

## 2012-09-22 NOTE — Telephone Encounter (Signed)
Called patient. Labs normal. Suspect IDA. Will hold of on replacing iron pending.   Due for mirena removal. Will remove at f/u appt. Use condoms with sex in the meantime.

## 2012-09-22 NOTE — Assessment & Plan Note (Signed)
IUD due for removal. Advised patient use condoms for contraception until follow up.

## 2012-09-22 NOTE — Assessment & Plan Note (Signed)
A: chronic pain. Recent symptoms concerning for gastritis. Chronic nature of pain concerning for IBS with constipation being predominant feature. No melena to suggest colon CA or IBD.  P: Daily PPI Referral to GI to evaluate and consider colonoscopy.  F/u with me in one month

## 2012-09-27 ENCOUNTER — Other Ambulatory Visit: Payer: Self-pay

## 2012-09-27 DIAGNOSIS — Z1231 Encounter for screening mammogram for malignant neoplasm of breast: Secondary | ICD-10-CM

## 2012-10-11 ENCOUNTER — Ambulatory Visit (INDEPENDENT_AMBULATORY_CARE_PROVIDER_SITE_OTHER): Payer: Managed Care, Other (non HMO) | Admitting: Family Medicine

## 2012-10-11 ENCOUNTER — Encounter: Payer: Self-pay | Admitting: Family Medicine

## 2012-10-11 VITALS — BP 110/72 | HR 94 | Temp 98.6°F | Ht 61.5 in | Wt 209.0 lb

## 2012-10-11 DIAGNOSIS — J45909 Unspecified asthma, uncomplicated: Secondary | ICD-10-CM

## 2012-10-11 DIAGNOSIS — J309 Allergic rhinitis, unspecified: Secondary | ICD-10-CM

## 2012-10-11 DIAGNOSIS — J45901 Unspecified asthma with (acute) exacerbation: Secondary | ICD-10-CM

## 2012-10-11 DIAGNOSIS — J069 Acute upper respiratory infection, unspecified: Secondary | ICD-10-CM

## 2012-10-11 DIAGNOSIS — J4521 Mild intermittent asthma with (acute) exacerbation: Secondary | ICD-10-CM

## 2012-10-11 MED ORDER — PHENYLEPHRINE HCL 1 % NA SOLN: 1.0000 [drp] | Freq: Four times a day (QID) | NASAL | Status: DC | PRN

## 2012-10-11 MED ORDER — PREDNISONE 20 MG PO TABS: 40.0000 mg | ORAL_TABLET | Freq: Every day | ORAL | Status: DC

## 2012-10-11 MED ORDER — CETIRIZINE HCL 10 MG PO CAPS: 10.0000 mg | ORAL_CAPSULE | Freq: Every day | ORAL | Status: DC

## 2012-10-11 NOTE — Progress Notes (Signed)
[facility] Tana Conch, MD Phone: [phone]  Subjective:  Chief complaint-noted  # Shortness of breath/URI symptoms Location-Patient with 3 days of productive cough, mild SOB, wheeze. Severity-mild to moderate Duration-4 days Context-Started after a day of sinus congestion, sore throat, rhinorrhea, sore throat, body aches which have continued. Tends to get similar symptoms every change in season.  Modifying factors-takes albuterol up to 4x a day which helps Associated symptoms-afebrile, congestion, drainage ROS-no nausea/vomiting. No weakness. No chest pain.   ROS--See HPI  Past Medical History-intermittent asthma (typically uses albuterol once a month or less except when gets URIs), obesity, nonsmoker  Reviewed problem list.  Medications- reviewed and updated Current Outpatient Prescriptions on File Prior to Visit  Medication Sig Dispense Refill  . albuterol (VENTOLIN HFA) 108 (90 BASE) MCG/ACT inhaler Inhale 2 puffs into the lungs every 4 (four) hours as needed. For shortness of breath.  8.5 g  1  . aluminum & magnesium hydroxide-simethicone (MYLANTA) 500-450-40 MG/5ML suspension Take 10 mLs by mouth as needed for indigestion.      Marland Kitchen levonorgestrel (MIRENA) 20 MCG/24HR IUD 1 each by Intrauterine route once.      Marland Kitchen omeprazole (PRILOSEC) 20 MG capsule Take 1 capsule (20 mg total) by mouth daily.  30 capsule  0   No current facility-administered medications on file prior to visit.    Objective: BP 110/72  Pulse 94  Temp(Src) 98.6 F (37 C) (Oral)  Ht 5' 1.5" (1.562 m)  Wt 209 lb (94.802 kg)  BMI 38.86 kg/m2  SpO2 98%  LMP 09/17/2012 Gen: NAD, resting comfortably HEENT: no sinus tenderness, slight rhinorrhea, TM normal bilaterally, no scleral injection CV: RRR no murmurs rubs or gallops Lungs: occasional wheeze, no respiratory distress Skin: warm, dry Neuro: grossly normal, moves all extremities Ext: no edema  Assessment/Plan:

## 2012-10-11 NOTE — Assessment & Plan Note (Signed)
Athma, acute exacerbation-treat with prednisone x 5 days, albuterol q6 over next 24 hours. F/u next week if not improving or sooner if worsens.   Likely trigger with viral URI-treat symptomatically per AVS. Also possible trigger seasonal allergies-will combine decongestant with antihistamine to try to assist with both issues.

## 2012-10-11 NOTE — Patient Instructions (Addendum)
Take Prednisone for your asthma.  Take Zyrtec for your seasonal allergies. Take phenylephrine spray for your congestion.   See [place] next week if not continuing to improve or sooner if things were to get worse after 2-3 days of treatment or if your albuterol use were not to decrease,  Dr. Durene Cal  Health Maintenance Due  Topic Date Due  . Mammogram  05/29/1988  . Influenza Vaccine  08/25/2012

## 2012-10-25 ENCOUNTER — Ambulatory Visit: Payer: Medicaid Other

## 2012-10-26 ENCOUNTER — Other Ambulatory Visit: Payer: Self-pay

## 2012-10-26 DIAGNOSIS — Z1231 Encounter for screening mammogram for malignant neoplasm of breast: Secondary | ICD-10-CM

## 2012-10-27 ENCOUNTER — Ambulatory Visit: Payer: Managed Care, Other (non HMO) | Admitting: Family Medicine

## 2012-10-31 ENCOUNTER — Ambulatory Visit: Payer: Managed Care, Other (non HMO)

## 2012-11-01 ENCOUNTER — Ambulatory Visit
Admission: RE | Admit: 2012-11-01 | Discharge: 2012-11-01 | Disposition: A | Discharge status: Home / Self Care | Payer: Managed Care, Other (non HMO) | Source: Ambulatory Visit

## 2012-11-01 DIAGNOSIS — Z1231 Encounter for screening mammogram for malignant neoplasm of breast: Secondary | ICD-10-CM

## 2012-11-06 ENCOUNTER — Other Ambulatory Visit: Payer: Self-pay | Admitting: Family Medicine

## 2012-11-06 DIAGNOSIS — R928 Other abnormal and inconclusive findings on diagnostic imaging of breast: Secondary | ICD-10-CM

## 2012-11-07 ENCOUNTER — Telehealth: Payer: Self-pay | Admitting: Family Medicine

## 2012-11-07 NOTE — Telephone Encounter (Signed)
Called patient.  She was aware of her abnormal mammogram and need for followup diagnostic mammogram with possible ultrasound. She had no concerns or questions.

## 2012-11-07 NOTE — Telephone Encounter (Signed)
Pt returnded dr Armen Pickup call. She was asleep and didn't understand what the message said.

## 2012-11-07 NOTE — Telephone Encounter (Signed)
Called patient. Left VM.  Reviewed abnormal screening mammogram F/u diagnostic mammogram and possibly u/s of both breast to be set up by the breast imaging center.   Patient asked to call with questions.

## 2012-11-07 NOTE — Telephone Encounter (Signed)
Message copied by Dessa Phi on Tue Nov 07, 2012  8:19 AM ------      Message from: CHAMBLISS, MARSHALL L      Created: Thu Nov 02, 2012  8:34 AM                   ----- Message -----         From: Rad Results In Interface         Sent: 11/01/2012   2:28 PM           To: Carney Living, MD             ------

## 2012-11-07 NOTE — Telephone Encounter (Signed)
Will forward to Dr. Funches 

## 2012-11-21 ENCOUNTER — Other Ambulatory Visit: Payer: Managed Care, Other (non HMO)

## 2012-11-28 ENCOUNTER — Ambulatory Visit
Admission: RE | Admit: 2012-11-28 | Discharge: 2012-11-28 | Disposition: A | Discharge status: Home / Self Care | Payer: Managed Care, Other (non HMO) | Source: Ambulatory Visit | Attending: Family Medicine | Admitting: Family Medicine

## 2012-11-28 ENCOUNTER — Other Ambulatory Visit: Payer: Self-pay | Admitting: Family Medicine

## 2012-11-28 DIAGNOSIS — R928 Other abnormal and inconclusive findings on diagnostic imaging of breast: Secondary | ICD-10-CM

## 2012-12-12 ENCOUNTER — Ambulatory Visit (INDEPENDENT_AMBULATORY_CARE_PROVIDER_SITE_OTHER): Payer: Managed Care, Other (non HMO) | Admitting: Family Medicine

## 2012-12-12 ENCOUNTER — Encounter: Payer: Self-pay | Admitting: Family Medicine

## 2012-12-12 VITALS — BP 106/48 | HR 93 | Temp 99.3°F | Ht 61.5 in | Wt 215.0 lb

## 2012-12-12 DIAGNOSIS — J45901 Unspecified asthma with (acute) exacerbation: Secondary | ICD-10-CM

## 2012-12-12 DIAGNOSIS — J309 Allergic rhinitis, unspecified: Secondary | ICD-10-CM

## 2012-12-12 DIAGNOSIS — J4521 Mild intermittent asthma with (acute) exacerbation: Secondary | ICD-10-CM

## 2012-12-12 MED ORDER — PREDNISONE 20 MG PO TABS: 40.0000 mg | ORAL_TABLET | Freq: Every day | ORAL | Status: DC

## 2012-12-12 MED ORDER — FLUTICASONE PROPIONATE 50 MCG/ACT NA SUSP: 2.0000 | Freq: Every day | NASAL | Status: DC

## 2012-12-12 MED ORDER — ALBUTEROL SULFATE HFA 108 (90 BASE) MCG/ACT IN AERS: 2.0000 | INHALATION_SPRAY | RESPIRATORY_TRACT | Status: DC | PRN

## 2012-12-12 NOTE — Progress Notes (Signed)
Patient ID: Rebecca Olson, female   DOB: 05-31-1970, 42 y.o.   MRN: [id number] FAMILY MEDICINE OFFICE NOTE  Chief Complaint:  cough  Primary Care Physician: Lora Paula, MD  HPI:  Rebecca Olson  is a 42 yo with a hx of intermittent asthma who presents today for a cough.   - has been coughing for the last 2 weeks - having sinus drainage also. - itching and water eyes - now starting to wheeze worse.  - having to use her albuterol now at least 6-7 times a day - being woken up from sleep to use inhaler.  - coughing up some phlegm  Hx of steroid burst last month for a similar series of events. Prior to that rarely used her inhaler.  Responds well to a steroid burst though and does not require a taper.   No fevers, chills, nausea, vomiting, chest pain, palpitations.     PMHx:  Past Medical History  Diagnosis Date  . Allergy   . Asthma   . Constipation   . IUD (intrauterine device) in place     Past Surgical History  Procedure Laterality Date  . Carpal tunnel with cubital tunnel      FAMHx:  Family History  Problem Relation Age of Onset  . Diabetes Mother   . Asthma Mother   . Hypertension Mother   . Cancer Father     pancreatic ca    SOCHx:   reports that she has never smoked. She has never used smokeless tobacco. She reports that she does not drink alcohol or use illicit drugs.  ALLERGIES:  No Known Allergies  ROS: Pertinent ROS as seen in HPI. Otherwise negative.   HOME MEDS: Current Outpatient Prescriptions  Medication Sig Dispense Refill  . albuterol (VENTOLIN HFA) 108 (90 BASE) MCG/ACT inhaler Inhale 2 puffs into the lungs every 4 (four) hours as needed. For shortness of breath.  8.5 g  1  . aluminum & magnesium hydroxide-simethicone (MYLANTA) 500-450-40 MG/5ML suspension Take 10 mLs by mouth as needed for indigestion.      . Cetirizine HCl 10 MG CAPS Take 1 capsule (10 mg total) by mouth daily.  30 capsule  5  . fluticasone (FLONASE) 50 MCG/ACT  nasal spray Place 2 sprays into both nostrils daily.  16 g  2  . levonorgestrel (MIRENA) 20 MCG/24HR IUD 1 each by Intrauterine route once.      Marland Kitchen omeprazole (PRILOSEC) 20 MG capsule Take 1 capsule (20 mg total) by mouth daily.  30 capsule  0  . phenylephrine (NEO-SYNEPHRINE) 1 % nasal spray Place 1 drop into the nose every 6 (six) hours as needed for congestion. 3 days maximum  30 mL  0  . predniSONE (DELTASONE) 20 MG tablet Take 2 tablets (40 mg total) by mouth daily.  10 tablet  0   No current facility-administered medications for this visit.    LABS/IMAGING: No results found for this or any previous visit (from the past 48 hour(s)). No results found.  VITALS: BP 106/48  Pulse 93  Temp(Src) 99.3 F (37.4 C) (Oral)  Ht 5' 1.5" (1.562 m)  Wt 215 lb (97.523 kg)  BMI 39.97 kg/m2  EXAM: Gen: NAD, well appearing, no increased WOB HEENT: oropharynx clear with some cobblestoning. Nasal congestion.  pupils equal and reactive PULM: normal effort. Diffuse expiratory wheezes worse in the upper lung fields CV: RRR, no murmurs EXT: 2+ DP pulses, no edema    ASSESSMENT: Asthma with acute exacerbation -  Plan: predniSONE (DELTASONE) 20 MG tablet  PLAN: See separate assessment and plan.

## 2012-12-12 NOTE — Patient Instructions (Signed)
1) start the steroids for your asthma 2) try flonase for the nasal congestion  If you find that once you are better, you need your inhaler more than twice a week, let us know so we can talk about starting a daily medicine.

## 2012-12-25 ENCOUNTER — Ambulatory Visit (INDEPENDENT_AMBULATORY_CARE_PROVIDER_SITE_OTHER): Payer: Managed Care, Other (non HMO) | Admitting: Family Medicine

## 2012-12-25 ENCOUNTER — Encounter: Payer: Self-pay | Admitting: Family Medicine

## 2012-12-25 VITALS — BP 115/83 | HR 89 | Temp 98.2°F | Wt 215.0 lb

## 2012-12-25 DIAGNOSIS — J4540 Moderate persistent asthma, uncomplicated: Secondary | ICD-10-CM

## 2012-12-25 DIAGNOSIS — Z23 Encounter for immunization: Secondary | ICD-10-CM

## 2012-12-25 DIAGNOSIS — J45901 Unspecified asthma with (acute) exacerbation: Secondary | ICD-10-CM

## 2012-12-25 DIAGNOSIS — J45909 Unspecified asthma, uncomplicated: Secondary | ICD-10-CM

## 2012-12-25 DIAGNOSIS — Z975 Presence of (intrauterine) contraceptive device: Secondary | ICD-10-CM

## 2012-12-25 MED ORDER — BECLOMETHASONE DIPROPIONATE 80 MCG/ACT IN AERS: 1.0000 | INHALATION_SPRAY | Freq: Two times a day (BID) | RESPIRATORY_TRACT | Status: DC

## 2012-12-25 NOTE — Progress Notes (Signed)
   Subjective:    Patient ID: Rebecca Olson, female    DOB: 11/19/1970, 42 y.o.   MRN: [id number]  HPI 42 yo F presents for f/u visit:  1. IUD removal/preconception counselling: consented for removal. Does not want other r contraceptions. Plans to begin sexual activity with her husband when he arrives to the [place] from [place] in March.  She had a normal pap with negative GC/chlam 02/2011. No documented negative HIV screen.   2. Asthma: seen last week for exacerbation. Completed prednisone. Still with cough. Increased albuterol use to 5 x daily. No fever. No known sick contacts. Non smoker.   Soc hx: non smoker   HC maintenance: flu shot given today   Review of Systems As per HPI    Objective:   Physical Exam BP 115/83  Pulse 89  Temp(Src) 98.2 F (36.8 C) (Oral)  Wt 215 lb (97.523 kg) General appearance: alert, cooperative and no distress Lungs: clear to auscultation bilaterally Pelvic: cervix normal in appearance, external genitalia normal, no adnexal masses or tenderness, no cervical motion tenderness, vagina normal without discharge and IUD strings visualized outside of cervical os. strings grasped with ring forceps. IUD removed w/o difficulty. minimal bleeding for os post removal.

## 2012-12-25 NOTE — Assessment & Plan Note (Signed)
A: improved. Still requiring excessive albuterol P: Start QVar 80 mg INH BID In addition to nasal steroid

## 2012-12-25 NOTE — Assessment & Plan Note (Signed)
Removed. 12/25/2012

## 2012-12-25 NOTE — Patient Instructions (Signed)
Rebecca Olson,  Thank you for coming in today. I am happy to hear about your husband coming to the [place].   Regarding prepregnancy planning: We removed your IUD today.  You had a normal pap smear with negative GC/chlam last year. We should check and HIV today.  Before beginning unprotected sex have your partner checked for STDs, he may establish here or go to the health department.  You should also start prenatal vitamins.   Regarding asthma: Starting controller medication, Qvar for everyday use.   Dr. Armen Pickup

## 2012-12-27 ENCOUNTER — Telehealth: Payer: Self-pay | Admitting: *Deleted

## 2012-12-27 NOTE — Telephone Encounter (Signed)
Pt is aware of results. Jazmin Hartsell,CMA  

## 2012-12-27 NOTE — Telephone Encounter (Signed)
Message copied by Henri Medal on Wed Dec 27, 2012  9:48 AM ------      Message from: Dessa Phi      Created: Tue Dec 26, 2012 10:57 AM       Negative HIV screen      Please inform patient. ------

## 2013-03-20 ENCOUNTER — Ambulatory Visit (INDEPENDENT_AMBULATORY_CARE_PROVIDER_SITE_OTHER): Payer: Managed Care, Other (non HMO) | Admitting: *Deleted

## 2013-03-20 DIAGNOSIS — Z23 Encounter for immunization: Secondary | ICD-10-CM

## 2013-05-07 ENCOUNTER — Ambulatory Visit: Payer: Managed Care, Other (non HMO) | Admitting: Family Medicine

## 2013-05-08 ENCOUNTER — Ambulatory Visit (INDEPENDENT_AMBULATORY_CARE_PROVIDER_SITE_OTHER): Payer: Managed Care, Other (non HMO) | Admitting: Family Medicine

## 2013-05-08 ENCOUNTER — Encounter: Payer: Self-pay | Admitting: Family Medicine

## 2013-05-08 VITALS — BP 137/82 | HR 91 | Temp 98.7°F | Ht 61.5 in | Wt 206.0 lb

## 2013-05-08 DIAGNOSIS — M549 Dorsalgia, unspecified: Secondary | ICD-10-CM

## 2013-05-08 DIAGNOSIS — M5380 Other specified dorsopathies, site unspecified: Secondary | ICD-10-CM

## 2013-05-08 DIAGNOSIS — M62830 Muscle spasm of back: Secondary | ICD-10-CM | POA: Insufficient documentation

## 2013-05-08 LAB — POCT UA - MICROSCOPIC ONLY

## 2013-05-08 LAB — POCT URINALYSIS DIPSTICK
Bilirubin, UA: NEGATIVE
Glucose, UA: NEGATIVE
Ketones, UA: NEGATIVE
Leukocytes, UA: NEGATIVE
NITRITE UA: NEGATIVE
PH UA: 7
PROTEIN UA: NEGATIVE
Spec Grav, UA: 1.025
UROBILINOGEN UA: 0.2

## 2013-05-08 MED ORDER — IBUPROFEN 600 MG PO TABS: 600.0000 mg | ORAL_TABLET | Freq: Three times a day (TID) | ORAL | Status: DC | PRN

## 2013-05-08 MED ORDER — RANITIDINE HCL 150 MG PO TABS: 150.0000 mg | ORAL_TABLET | Freq: Two times a day (BID) | ORAL | Status: DC

## 2013-05-08 MED ORDER — CYCLOBENZAPRINE HCL 10 MG PO TABS: 10.0000 mg | ORAL_TABLET | Freq: Three times a day (TID) | ORAL | Status: DC | PRN

## 2013-05-08 NOTE — Assessment & Plan Note (Signed)
-   most consistent with back spasm and low back pain - no red flag symptoms - rx of ibuprofen tid x 10 days - flexeril at night time - low back exercises printed and filled out  - discussed may take up to 6 weeks to improve - advise to take it easy at work  - f/u if no improvement over next month.     Given reflux concerns. rx of ranitidine also for stomach protection while on ibuprofen.

## 2013-05-08 NOTE — Progress Notes (Signed)
Patient ID: Rebecca Olson, female   DOB: [birth year], 43 y.o.   MRN: [id number] FAMILY MEDICINE OFFICE NOTE  Chief Complaint:  Abdominal pain  Primary Care Physician: Minerva Ends, MD  HPI:  Rebecca Olson is a 43 yo with a hx of asthma who presents today for back pain.  States woke up in excruciating pain in her right lower back in the middle of the night on Friday.  Saturday she was unable to gety out of bed. Since then it has gotten somewhat better but still bothers her particularly at work.  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   Says it feels like a sharp pull.  Worse with standing. Spreads from her back down to her buttock.  Does not go down her leg.   No fevers, chills, bowel/bladder changes.     Does on ros have some heartburn and reflux for which she takes mylanta regularly.  No cp, sob, HA.       PMHx:  Past Medical History  Diagnosis Date  . Allergy   . Asthma   . Constipation   . IUD (intrauterine device) in place     Past Surgical History  Procedure Laterality Date  . Carpal tunnel with cubital tunnel      FAMHx:  Family History  Problem Relation Age of Onset  . Diabetes Mother   . Asthma Mother   . Hypertension Mother   . Cancer Father     pancreatic ca    SOCHx:   reports that she has never smoked. She has never used smokeless tobacco. She reports that she does not drink alcohol or use illicit drugs.  ALLERGIES:  No Known Allergies  ROS: Pertinent ROS as seen in HPI. Otherwise negative.   HOME MEDS: Current Outpatient Prescriptions  Medication Sig Dispense Refill  . albuterol (VENTOLIN HFA) 108 (90 BASE) MCG/ACT inhaler Inhale 2 puffs into the lungs every 4 (four) hours as needed. For shortness of breath.  8.5 g  1  . aluminum & magnesium hydroxide-simethicone (MYLANTA) 500-450-40 MG/5ML suspension Take 10 mLs by mouth as needed for indigestion.      . beclomethasone (QVAR) 80 MCG/ACT  inhaler Inhale 1 puff into the lungs 2 (two) times daily.  1 Inhaler  12  . Cetirizine HCl 10 MG CAPS Take 1 capsule (10 mg total) by mouth daily.  30 capsule  5  . cyclobenzaprine (FLEXERIL) 10 MG tablet Take 1 tablet (10 mg total) by mouth 3 (three) times daily as needed for muscle spasms.  30 tablet  0  . fluticasone (FLONASE) 50 MCG/ACT nasal spray Place 2 sprays into both nostrils daily.  16 g  2  . ibuprofen (ADVIL,MOTRIN) 600 MG tablet Take 1 tablet (600 mg total) by mouth every 8 (eight) hours as needed.  30 tablet  0  . levonorgestrel (MIRENA) 20 MCG/24HR IUD 1 each by Intrauterine route once.      Marland Kitchen omeprazole (PRILOSEC) 20 MG capsule Take 1 capsule (20 mg total) by mouth daily.  30 capsule  0  . phenylephrine (NEO-SYNEPHRINE) 1 % nasal spray Place 1 drop into the nose every 6 (six) hours as needed for congestion. 3 days maximum  30 mL  0  . ranitidine (ZANTAC) 150 MG tablet Take 1 tablet (150 mg total) by mouth 2 (two) times daily.  60 tablet  2   No current facility-administered medications for this visit.  LABS/IMAGING: Results for orders placed in visit on 05/08/13 (from the past 48 hour(s))  POCT URINALYSIS DIPSTICK     Status: Abnormal   Collection Time    05/08/13  2:02 PM      Result Value Ref Range   Color, UA YELLOW     Clarity, UA CLEAR     Glucose, UA NEGATIVE     Bilirubin, UA NEGATIVE     Ketones, UA NEGATIVE     Spec Grav, UA 1.025     Blood, UA SMALL     pH, UA 7.0     Protein, UA NEGATIVE     Urobilinogen, UA 0.2     Nitrite, UA NEGATIVE     Leukocytes, UA Negative    POCT UA - MICROSCOPIC ONLY     Status: None   Collection Time    05/08/13  2:02 PM      Result Value Ref Range   WBC, Ur, HPF, POC OCCASIONAL     RBC, urine, microscopic RARE     Bacteria, U Microscopic TRACE     Mucus, UA TRACE     Epithelial cells, urine per micros 5-10     No results found.  VITALS: BP 137/82  Pulse 91  Temp(Src) 98.7 F (37.1 C) (Oral)  Ht 5' 1.5" (1.562  m)  Wt 206 lb (93.441 kg)  BMI 38.30 kg/m2  EXAM: Gen: NAD, well appearing PULM: LCTAB, no wheezes/rhonchi/rales CV: RRR, no murmurs ABD: soft, NT, ND BACK: palpable spasm on right paraspinal muscles.  Decreased flexion to 60 degrees, extension normal. Rotation limited to the left.  Tender on palpation of the paraspinals. No tenderness on spine. Pain worsened by flexion of the hip but negative straight leg raise.   EXT: 2+ DP pulses,  no edema NEURO: 2+ DTRs, no clonus    ASSESSMENT: Back pain - Plan: POCT urinalysis dipstick, POCT UA - Microscopic Only  Back muscle spasm  PLAN: - most consistent with back spasm and low back pain - no red flag symptoms - rx of ibuprofen tid x 10 days - flexeril at night time - low back exercises printed and filled out  - discussed may take up to 6 weeks to improve - advise to take it easy at work  - f/u if no improvement over next month.     Given reflux concerns. rx of ranitidine also for stomach protection while on ibuprofen.

## 2013-05-08 NOTE — Patient Instructions (Signed)
Back Exercises These exercises may help you when beginning to rehabilitate your injury. Your symptoms may resolve with or without further involvement from your physician, physical therapist or athletic trainer. While completing these exercises, remember:   Restoring tissue flexibility helps normal motion to return to the joints. This allows healthier, less painful movement and activity.  An effective stretch should be held for at least 30 seconds.  A stretch should never be painful. You should only feel a gentle lengthening or release in the stretched tissue. STRETCH  Extension, Prone on Elbows   Lie on your stomach on the floor, a bed will be too soft. Place your palms about shoulder width apart and at the height of your head.  Place your elbows under your shoulders. If this is too painful, stack pillows under your chest.  Allow your body to relax so that your hips drop lower and make contact more completely with the floor.  Hold this position for __________ seconds.  Slowly return to lying flat on the floor. Repeat __________ times. Complete this exercise __________ times per day.  RANGE OF MOTION  Extension, Prone Press Ups   Lie on your stomach on the floor, a bed will be too soft. Place your palms about shoulder width apart and at the height of your head.  Keeping your back as relaxed as possible, slowly straighten your elbows while keeping your hips on the floor. You may adjust the placement of your hands to maximize your comfort. As you gain motion, your hands will come more underneath your shoulders.  Hold this position __________ seconds.  Slowly return to lying flat on the floor. Repeat __________ times. Complete this exercise __________ times per day.  RANGE OF MOTION- Quadruped, Neutral Spine   Assume a hands and knees position on a firm surface. Keep your hands under your shoulders and your knees under your hips. You may place padding under your knees for comfort.  Drop  your head and point your tail bone toward the ground below you. This will round out your low back like an angry cat. Hold this position for __________ seconds.  Slowly lift your head and release your tail bone so that your back sags into a large arch, like an old horse.  Hold this position for __________ seconds.  Repeat this until you feel limber in your low back.  Now, find your "sweet spot." This will be the most comfortable position somewhere between the two previous positions. This is your neutral spine. Once you have found this position, tense your stomach muscles to support your low back.  Hold this position for __________ seconds. Repeat __________ times. Complete this exercise __________ times per day.  STRETCH  Flexion, Single Knee to Chest   Lie on a firm bed or floor with both legs extended in front of you.  Keeping one leg in contact with the floor, bring your opposite knee to your chest. Hold your leg in place by either grabbing behind your thigh or at your knee.  Pull until you feel a gentle stretch in your low back. Hold __________ seconds.  Slowly release your grasp and repeat the exercise with the opposite side. Repeat __________ times. Complete this exercise __________ times per day.  STRETCH - Hamstrings, Standing  Stand or sit and extend your right / left leg, placing your foot on a chair or foot stool  Keeping a slight arch in your low back and your hips straight forward.  Lead with your chest and   lean forward at the waist until you feel a gentle stretch in the back of your right / left knee or thigh. (When done correctly, this exercise requires leaning only a small distance.)  Hold this position for __________ seconds. Repeat __________ times. Complete this stretch __________ times per day. STRENGTHENING  Deep Abdominals, Pelvic Tilt   Lie on a firm bed or floor. Keeping your legs in front of you, bend your knees so they are both pointed toward the ceiling and  your feet are flat on the floor.  Tense your lower abdominal muscles to press your low back into the floor. This motion will rotate your pelvis so that your tail bone is scooping upwards rather than pointing at your feet or into the floor.  With a gentle tension and even breathing, hold this position for __________ seconds. Repeat __________ times. Complete this exercise __________ times per day.  STRENGTHENING  Abdominals, Crunches   Lie on a firm bed or floor. Keeping your legs in front of you, bend your knees so they are both pointed toward the ceiling and your feet are flat on the floor. Cross your arms over your chest.  Slightly tip your chin down without bending your neck.  Tense your abdominals and slowly lift your trunk high enough to just clear your shoulder blades. Lifting higher can put excessive stress on the low back and does not further strengthen your abdominal muscles.  Control your return to the starting position. Repeat __________ times. Complete this exercise __________ times per day.  STRENGTHENING  Quadruped, Opposite UE/LE Lift   Assume a hands and knees position on a firm surface. Keep your hands under your shoulders and your knees under your hips. You may place padding under your knees for comfort.  Find your neutral spine and gently tense your abdominal muscles so that you can maintain this position. Your shoulders and hips should form a rectangle that is parallel with the floor and is not twisted.  Keeping your trunk steady, lift your right hand no higher than your shoulder and then your left leg no higher than your hip. Make sure you are not holding your breath. Hold this position __________ seconds.  Continuing to keep your abdominal muscles tense and your back steady, slowly return to your starting position. Repeat with the opposite arm and leg. Repeat __________ times. Complete this exercise __________ times per day. Document Released: 01/29/2005 Document  Revised: 04/05/2011 Document Reviewed: 04/25/2008 Exit[employer] Patient Information 2014 [employer].  

## 2013-06-21 ENCOUNTER — Ambulatory Visit: Payer: Managed Care, Other (non HMO) | Admitting: Family Medicine

## 2013-06-21 ENCOUNTER — Encounter: Payer: Self-pay | Admitting: Family Medicine

## 2013-07-02 ENCOUNTER — Ambulatory Visit: Payer: Managed Care, Other (non HMO) | Admitting: Family Medicine

## 2013-07-03 ENCOUNTER — Ambulatory Visit (INDEPENDENT_AMBULATORY_CARE_PROVIDER_SITE_OTHER): Payer: Managed Care, Other (non HMO) | Admitting: Family Medicine

## 2013-07-03 ENCOUNTER — Encounter: Payer: Self-pay | Admitting: Family Medicine

## 2013-07-03 VITALS — BP 130/74 | HR 90 | Temp 99.2°F | Ht 61.5 in | Wt 205.0 lb

## 2013-07-03 DIAGNOSIS — R109 Unspecified abdominal pain: Secondary | ICD-10-CM

## 2013-07-03 DIAGNOSIS — R1013 Epigastric pain: Secondary | ICD-10-CM

## 2013-07-03 DIAGNOSIS — N920 Excessive and frequent menstruation with regular cycle: Secondary | ICD-10-CM

## 2013-07-03 LAB — POCT URINALYSIS DIPSTICK
BILIRUBIN UA: NEGATIVE
Blood, UA: NEGATIVE
Glucose, UA: NEGATIVE
Ketones, UA: NEGATIVE
LEUKOCYTES UA: NEGATIVE
NITRITE UA: NEGATIVE
Protein, UA: NEGATIVE
Spec Grav, UA: 1.025
Urobilinogen, UA: 0.2
pH, UA: 5.5

## 2013-07-03 LAB — CBC
HCT: 35.8 % — ABNORMAL LOW (ref 36.0–46.0)
Hemoglobin: 12.4 g/dL (ref 12.0–15.0)
MCH: 23.6 pg — AB (ref 26.0–34.0)
MCHC: 34.6 g/dL (ref 30.0–36.0)
MCV: 68.2 fL — ABNORMAL LOW (ref 78.0–100.0)
PLATELETS: 302 10*3/uL (ref 150–400)
RBC: 5.25 MIL/uL — ABNORMAL HIGH (ref 3.87–5.11)
RDW: 16.2 % — ABNORMAL HIGH (ref 11.5–15.5)
WBC: 10.4 10*3/uL (ref 4.0–10.5)

## 2013-07-03 LAB — POCT URINE PREGNANCY: PREG TEST UR: NEGATIVE

## 2013-07-03 NOTE — Progress Notes (Signed)
   Subjective:    Patient ID: Rebecca Olson, female    DOB: 1970/12/20, 43 y.o.   MRN: [id number] CC: abdominal pain   HPI 43 yo F presents for complaint of abdominal pain x 3-4 months. Pain is epigastric. Non radiating. Sometimes burning. No associated with eating. Associated with nausea. No emesis or fever. Taking H2 blocker daily. Not taking PPI.   Menorrhagia. Bled x 2 months. No bleeding now. No contraception. No sex recently. No vaginal discharge. Desires conception. Husband currently living in [place].   Soc hx: non smoker. Drinks glass of wine with dinner occasionally.  Review of Systems As per HPI     Objective:   Physical Exam BP 130/74  Pulse 90  Temp(Src) 99.2 F (37.3 C) (Oral)  Ht 5' 1.5" (1.562 m)  Wt 205 lb (92.987 kg)  BMI 38.11 kg/m2  LMP 06/26/2013 General appearance: alert, cooperative and no distress Back: symmetric, no curvature. ROM normal. No CVA tenderness. Abdomen: round, obese, TTP epigastric area, no rebound or guarding.  Pelvic deferred   Reviewed labs: Positive H pylori IgG in 2011    Assessment & Plan:

## 2013-07-03 NOTE — Assessment & Plan Note (Addendum)
A: daily epigastric pain not improving with PPI or H2 blocker. Hx of positive H pylori in 2011. No fever, chills, weight loss, non smoker, no ETOH. P: Urea breath test to assess for TOC of previously treated H pylori. Treat if positive.  Negative H pylori breath test, normal CBC except for microcytosis. Sent in carafate trial. Will consider ABUS vs GI referral if symptoms past trial of carafate.

## 2013-07-03 NOTE — Assessment & Plan Note (Addendum)
A: menstrual bleeding x two months. Now resolved.  P: CBC F/u prn return for pelvic exam. Treatment options for recurrent long bleeding  include replace IUD works best, birth control pills next best, scheduled high dose NSAIDs.

## 2013-07-03 NOTE — Patient Instructions (Signed)
Rebecca Olson,   Thank you for coming in today.  I am rechecking you for H pylor the bacteria that is sometimes associated with gastritis and stomach ulcers.   For recent prolonged menstrual bleeding: checking CBC. Treatment options for recurrent long bleeding  include replace IUD works best, birth control pills next best, scheduled high dose NSAIDs.  Consider options.   Dr. Adrian Blackwater

## 2013-07-04 ENCOUNTER — Telehealth: Payer: Self-pay | Admitting: *Deleted

## 2013-07-04 LAB — H. PYLORI BREATH TEST: H. pylori Breath Test: NOT DETECTED

## 2013-07-04 NOTE — Telephone Encounter (Signed)
Relayed message,patient voiced understand.Rebecca Olson, Rebecca Olson

## 2013-07-04 NOTE — Telephone Encounter (Signed)
Message copied by Corinna Capra on Wed Jul 04, 2013  2:43 PM ------      Message from: Boykin Nearing      Created: Wed Jul 04, 2013  8:10 AM       Please inform: patient not anemic.             Aside: no need to pass on to patient.       microcytosis w/o anemia.      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       No intervention.        ------

## 2013-07-06 ENCOUNTER — Telehealth: Payer: Self-pay | Admitting: Family Medicine

## 2013-07-06 MED ORDER — SUCRALFATE 1 G PO TABS: 1.0000 g | ORAL_TABLET | Freq: Two times a day (BID) | ORAL | Status: DC

## 2013-07-06 NOTE — Addendum Note (Signed)
Addended by: Boykin Nearing on: 07/06/2013 11:48 AM   Modules accepted: Orders

## 2013-07-06 NOTE — Telephone Encounter (Signed)
Called patient. Negative urease breath test, normal Hgb. Plan for chronic epigastric pain is carafate trial x 2-3 weeks. With recommendation for new PCP to order ABUS and consider GI referral if pain persist past trial. Patient agrees to plan and voices understanding.

## 2013-09-24 ENCOUNTER — Ambulatory Visit: Payer: Managed Care, Other (non HMO) | Admitting: Family Medicine

## 2013-09-28 ENCOUNTER — Ambulatory Visit: Payer: Managed Care, Other (non HMO) | Admitting: Family Medicine

## 2013-10-17 ENCOUNTER — Ambulatory Visit: Payer: Managed Care, Other (non HMO) | Admitting: Family Medicine

## 2014-02-22 ENCOUNTER — Other Ambulatory Visit: Payer: Self-pay

## 2014-02-22 DIAGNOSIS — Z1231 Encounter for screening mammogram for malignant neoplasm of breast: Secondary | ICD-10-CM

## 2014-06-05 ENCOUNTER — Ambulatory Visit: Payer: Managed Care, Other (non HMO) | Admitting: Family Medicine

## 2014-06-19 ENCOUNTER — Ambulatory Visit: Payer: Managed Care, Other (non HMO) | Admitting: Family Medicine

## 2014-07-04 ENCOUNTER — Other Ambulatory Visit: Payer: Self-pay | Admitting: Family Medicine

## 2014-07-04 DIAGNOSIS — J4521 Mild intermittent asthma with (acute) exacerbation: Secondary | ICD-10-CM

## 2014-07-04 MED ORDER — ALBUTEROL SULFATE HFA 108 (90 BASE) MCG/ACT IN AERS: 2.0000 | INHALATION_SPRAY | RESPIRATORY_TRACT | Status: DC | PRN

## 2014-07-04 NOTE — Telephone Encounter (Signed)
Refill given. Patient has not been seen in the past year. Please advise she needs a follow-up appointment. Thanks.

## 2014-07-04 NOTE — Telephone Encounter (Signed)
Mrs. Tahir is leaving the country soon and would like a refill on albuterol (VENTOLIN HFA). Please follow up with her and let her know, thank you, Fonda Kinder, ASA

## 2014-07-05 NOTE — Telephone Encounter (Signed)
Pt has an appt on 07-15-2014

## 2014-07-15 ENCOUNTER — Ambulatory Visit: Payer: Managed Care, Other (non HMO) | Admitting: Family Medicine

## 2014-11-11 ENCOUNTER — Ambulatory Visit: Payer: Managed Care, Other (non HMO) | Admitting: Family Medicine

## 2015-03-03 ENCOUNTER — Encounter: Payer: Self-pay | Admitting: Family Medicine

## 2015-03-03 ENCOUNTER — Ambulatory Visit (INDEPENDENT_AMBULATORY_CARE_PROVIDER_SITE_OTHER): Payer: Self-pay | Admitting: Family Medicine

## 2015-03-03 ENCOUNTER — Other Ambulatory Visit (HOSPITAL_COMMUNITY)
Admission: RE | Admit: 2015-03-03 | Discharge: 2015-03-03 | Disposition: A | Discharge status: Home / Self Care | Payer: Managed Care, Other (non HMO) | Source: Ambulatory Visit | Attending: Family Medicine | Admitting: Family Medicine

## 2015-03-03 VITALS — BP 120/69 | HR 102 | Temp 98.8°F | Wt 196.5 lb

## 2015-03-03 DIAGNOSIS — Z113 Encounter for screening for infections with a predominantly sexual mode of transmission: Secondary | ICD-10-CM | POA: Insufficient documentation

## 2015-03-03 DIAGNOSIS — Z0000 Encounter for general adult medical examination without abnormal findings: Secondary | ICD-10-CM | POA: Insufficient documentation

## 2015-03-03 DIAGNOSIS — Z1239 Encounter for other screening for malignant neoplasm of breast: Secondary | ICD-10-CM

## 2015-03-03 DIAGNOSIS — N898 Other specified noninflammatory disorders of vagina: Secondary | ICD-10-CM

## 2015-03-03 LAB — POCT WET PREP (WET MOUNT): Clue Cells Wet Prep Whiff POC: POSITIVE

## 2015-03-03 LAB — POCT URINE PREGNANCY: Preg Test, Ur: NEGATIVE

## 2015-03-03 MED ORDER — METRONIDAZOLE 500 MG PO TABS: 500.0000 mg | ORAL_TABLET | Freq: Two times a day (BID) | ORAL | Status: AC

## 2015-03-03 NOTE — Progress Notes (Signed)
    Subjective:  Rebecca Olson is a 45 y.o. female who presents to the [facility] today for same day appointment with a chief complaint of vaginal discharge.   HPI:  Vaginal Discharge.  Symptoms started several months ago. Over the past month has noticed increased itchiness and odor. Discharge is thick and yellow. Patient has tried several home remidies including vinegar in her bath water and eating yogurt which has not helped. Patient is sexually active and reports using condoms every time. LMP on 02/17/2015. Patient had Mirena removed last year. No fevers or chills. No abdominal pain.   ROS: Per HPI  PMH:  The following were reviewed and entered/updated in epic: Past Medical History  Diagnosis Date  . Allergy   . Asthma   . Constipation    Patient Active Problem List   Diagnosis Date Noted  . Abdominal pain, epigastric 07/03/2013  . Menorrhagia 07/03/2013  . Back muscle spasm 05/08/2013  . Asthma with acute exacerbation 12/12/2012  . Allergic rhinitis 12/12/2012  . OBESITY, NOS 03/24/2006  . Asthma, moderate persistent 03/24/2006   Past Surgical History  Procedure Laterality Date  . Carpal tunnel with cubital tunnel       Objective:  Physical Exam: BP 120/69 mmHg  Pulse 102  Temp(Src) 98.8 F (37.1 C) (Oral)  Wt 196 lb 8 oz (89.132 kg)  LMP 02/17/2015 (Exact Date)  Gen: NAD, resting comfortably GI: Normal bowel sounds present. Soft, Nontender, Nondistended. GU: Normal external female genitalia. Thin white discharge noted in vaginal vault. No CMT.   Results for orders placed or performed in visit on 03/03/15 (from the past 72 hour(s))  POCT Wet Prep [facility] [place])     Status: Abnormal   Collection Time: 03/03/15  2:39 PM  Result Value Ref Range   Source Wet Prep POC VAG    WBC, Wet Prep HPF POC 1-5    Bacteria Wet Prep HPF POC Many (A) None, Few, Too numerous to count   Clue Cells Wet Prep HPF POC Many (A) None, Too numerous to count   Clue Cells Wet Prep Whiff POC  Positive Whiff    Yeast Wet Prep HPF POC None    Trichomonas Wet Prep HPF POC NONE   POCT urine pregnancy     Status: None   Collection Time: 03/03/15  2:39 PM  Result Value Ref Range   Preg Test, Ur Negative Negative    Assessment/Plan:  Vaginal Discharge Wet Prep consistent with BV. Will treat with course of flagyl. GC/CT pending. Follow up as needed.  Healthcare maintenance Mammogram ordered today.    [name]. [name], [place] Resident PGY-2 03/03/2015 4:02 PM

## 2015-03-03 NOTE — Patient Instructions (Signed)

## 2015-03-03 NOTE — Assessment & Plan Note (Signed)
Mammogram ordered today 

## 2015-03-04 ENCOUNTER — Encounter: Payer: Self-pay | Admitting: Family Medicine

## 2015-03-04 LAB — CERVICOVAGINAL ANCILLARY ONLY
Chlamydia: NEGATIVE
Neisseria Gonorrhea: NEGATIVE

## 2015-03-13 ENCOUNTER — Ambulatory Visit: Payer: Managed Care, Other (non HMO)

## 2015-05-19 ENCOUNTER — Ambulatory Visit
Admission: RE | Admit: 2015-05-19 | Discharge: 2015-05-19 | Disposition: A | Discharge status: Home / Self Care | Payer: No Typology Code available for payment source | Source: Ambulatory Visit | Attending: Family Medicine | Admitting: Family Medicine

## 2015-05-19 DIAGNOSIS — Z1239 Encounter for other screening for malignant neoplasm of breast: Secondary | ICD-10-CM

## 2015-06-18 ENCOUNTER — Ambulatory Visit (INDEPENDENT_AMBULATORY_CARE_PROVIDER_SITE_OTHER): Payer: Self-pay | Admitting: Family Medicine

## 2015-06-18 ENCOUNTER — Encounter: Payer: Self-pay | Admitting: Family Medicine

## 2015-06-18 VITALS — BP 120/59 | HR 89 | Temp 98.3°F | Wt 208.0 lb

## 2015-06-18 DIAGNOSIS — J4521 Mild intermittent asthma with (acute) exacerbation: Secondary | ICD-10-CM

## 2015-06-18 DIAGNOSIS — J4540 Moderate persistent asthma, uncomplicated: Secondary | ICD-10-CM

## 2015-06-18 MED ORDER — PREDNISONE 50 MG PO TABS: 50.0000 mg | ORAL_TABLET | Freq: Every day | ORAL | Status: DC

## 2015-06-18 MED ORDER — ALBUTEROL SULFATE (2.5 MG/3ML) 0.083% IN NEBU
2.5000 mg | INHALATION_SOLUTION | Freq: Once | RESPIRATORY_TRACT | Status: AC
  Administered 2015-06-18: 2.5 mg via RESPIRATORY_TRACT

## 2015-06-18 NOTE — Patient Instructions (Signed)
Take prednisone 50mg  daily for 5 days Continue to use albuterol 2 puffs every 4 hours as needed  Follow up if not getting better with this  Be well, Dr. Ardelia Mems   Asthma, Acute Bronchospasm Acute bronchospasm caused by asthma is also referred to as an asthma attack. Bronchospasm means your air passages become narrowed. The narrowing is caused by inflammation and tightening of the muscles in the air tubes (bronchi) in your lungs. This can make it hard to breathe or cause you to wheeze and cough. CAUSES Possible triggers are:  Animal dander from the skin, hair, or feathers of animals.  Dust mites contained in house dust.  Cockroaches.  Pollen from trees or grass.  Mold.  Cigarette or tobacco smoke.  Air pollutants such as dust, household cleaners, hair sprays, aerosol sprays, paint fumes, strong chemicals, or strong odors.  Cold air or weather changes. Cold air may trigger inflammation. Winds increase molds and pollens in the air.  Strong emotions such as crying or laughing hard.  Stress.  Certain medicines such as aspirin or beta-blockers.  Sulfites in foods and drinks, such as dried fruits and wine.  Infections or inflammatory conditions, such as a flu, cold, or inflammation of the nasal membranes (rhinitis).  Gastroesophageal reflux disease (GERD). GERD is a condition where stomach acid backs up into your esophagus.  Exercise or strenuous activity. SIGNS AND SYMPTOMS   Wheezing.  Excessive coughing, particularly at night.  Chest tightness.  Shortness of breath. DIAGNOSIS  Your health care provider will ask you about your medical history and perform a physical exam. A chest X-ray or blood testing may be performed to look for other causes of your symptoms or other conditions that may have triggered your asthma attack. TREATMENT  Treatment is aimed at reducing inflammation and opening up the airways in your lungs. Most asthma attacks are treated with inhaled  medicines. These include quick relief or rescue medicines (such as bronchodilators) and controller medicines (such as inhaled corticosteroids). These medicines are sometimes given through an inhaler or a nebulizer. Systemic steroid medicine taken by mouth or given through an IV tube also can be used to reduce the inflammation when an attack is moderate or severe. Antibiotic medicines are only used if a bacterial infection is present.  HOME CARE INSTRUCTIONS   Rest.  Drink plenty of liquids. This helps the mucus to remain thin and be easily coughed up. Only use caffeine in moderation and do not use alcohol until you have recovered from your illness.  Do not smoke. Avoid being exposed to secondhand smoke.  You play a critical role in keeping yourself in good health. Avoid exposure to things that cause you to wheeze or to have breathing problems.  Keep your medicines up-to-date and available. Carefully follow your health care provider's treatment plan.  Take your medicine exactly as prescribed.  When pollen or pollution is bad, keep windows closed and use an air conditioner or go to places with air conditioning.  Asthma requires careful medical care. See your health care provider for a follow-up as advised. If you are more than [redacted] weeks pregnant and you were prescribed any new medicines, let your obstetrician know about the visit and how you are doing. Follow up with your health care provider as directed.  After you have recovered from your asthma attack, make an appointment with your outpatient doctor to talk about ways to reduce the likelihood of future attacks. If you do not have a doctor who manages your  asthma, make an appointment with a primary care doctor to discuss your asthma. SEEK IMMEDIATE MEDICAL CARE IF:   You are getting worse.  You have trouble breathing. If severe, call your local emergency services (911 in the [place]  You develop chest pain or discomfort.  You are  vomiting.  You are not able to keep fluids down.  You are coughing up yellow, green, brown, or bloody sputum.  You have a fever and your symptoms suddenly get worse.  You have trouble swallowing. MAKE SURE YOU:   Understand these instructions.  Will watch your condition.  Will get help right away if you are not doing well or get worse.   This information is not intended to replace advice given to you by your health care provider. Make sure you discuss any questions you have with your health care provider.   Document Released: 04/28/2006 Document Revised: 01/16/2013 Document Reviewed: 07/19/2012 Elsevier Interactive Patient Education [employer].

## 2015-06-18 NOTE — Progress Notes (Signed)
Date of Visit: 06/18/2015   HPI:  Patient presents for a same day appointment to discuss wheezing.  Has been wheezing for 2 weeks. Has history of asthma an dhas been using albuterol inhaler without much relief. Feels tightness in her chest. No fever. Has been coughing. Has chronic nasal congestion for which she uses flonase. This feels like a typical asthma exacerbation though she has not had one in a while. Prior to this, was using albuterol only rarely. Using it presently about 5-6 times per day. Denies chest pain with exertion. No swelling.   ROS: See HPI  [place]: history of obesity, allergic rhinitis, asthma, menorrhagia  PHYSICAL EXAM: BP 120/59 mmHg  Pulse 89  Temp(Src) 98.3 F (36.8 C) (Oral)  Wt 208 lb (94.348 kg) O2 sat 100% Gen: NAD, pleasant, cooperative, well appearing HEENT: normocephalic, atraumatic. Moist mucous membranes. Tympanic membranes clear bilaterally, nares patent, oropharynx clear and moist, No anterior cervical or supraclavicular lymphadenopathy.  Heart: regular rate and rhythm, no murmur Lungs: normal work of breathing, speaks in full sentences without distress. Expiratory wheezes in upper lung fields bilaterally. Post albuterol neb, improved aeration with no further wheezing Neuro: alert, grossly nonfocal, speech normal  ASSESSMENT/PLAN:  1. Asthma exacerbation - no signs of bacterial infection or complicated asthma. Will treat with prednisone 50mg  daily for 5 days, continue beta2 agonist as needed. Follow up if not improving. Handout given. Discussed return precautions with patient. She is agreeable to this plan.  FOLLOW UP: Follow up as needed if symptoms worsen or fail to improve.    [place]. Ardelia Mems, [place]

## 2015-08-29 ENCOUNTER — Other Ambulatory Visit: Payer: Self-pay | Admitting: Family Medicine

## 2015-08-29 ENCOUNTER — Ambulatory Visit (INDEPENDENT_AMBULATORY_CARE_PROVIDER_SITE_OTHER): Payer: Self-pay | Admitting: Family Medicine

## 2015-08-29 ENCOUNTER — Encounter: Payer: Self-pay | Admitting: Family Medicine

## 2015-08-29 ENCOUNTER — Telehealth: Payer: Self-pay

## 2015-08-29 VITALS — BP 122/55 | HR 72 | Temp 98.2°F | Ht 61.5 in | Wt 216.2 lb

## 2015-08-29 DIAGNOSIS — N6452 Nipple discharge: Secondary | ICD-10-CM | POA: Insufficient documentation

## 2015-08-29 DIAGNOSIS — N644 Mastodynia: Secondary | ICD-10-CM

## 2015-08-29 LAB — POCT URINE PREGNANCY: PREG TEST UR: NEGATIVE

## 2015-08-29 NOTE — Telephone Encounter (Signed)
Pt returned call. I informed her [facility] with an arrival time of 1:50 pm. Ottis Stain, [place]

## 2015-08-29 NOTE — Telephone Encounter (Signed)
LVM for pt to call the office. Please inform her that she has an appt at the [facility] on Tues, Aug 8,2017. She needs to be there at 1:50pm. Ottis Stain, [place]

## 2015-08-29 NOTE — Assessment & Plan Note (Signed)
Patient presenting with unilateral breast discharge. Given that it is unilateral and was once blood tinged, it is more worrisome. Urine pregnancy negative. No headaches or vision change. Given it is unilateral, I doubt hyperprolactinemia or thyroid dysfunction. Most likely still physiologic but would like to r/o intraductal papilloma vs intraductal carcinoma. - ductogram ordered, although the pt has a mammogram in [name], I was told she would require a diagnostic mammogram and ultrasound as well. - discussed return precautions.

## 2015-08-29 NOTE — Progress Notes (Signed)
Subjective: [id number] pain HPI: Patient is a 45 y.o. female presenting to clinic today for breast pain and nipple discharge.  The patient started having itching of left breast and then progressed to pain 2 days. The pain only occurs in the nipple. She noted nipple discharge a few days later- it is think and creamy. It is typically beige/yellow in color. Yesterday she noted it looked bloody however it is not doing it today.   No manipulation of the breasts recently. Never had this happen before. Last pregnant 1993.  She has baseline headaches but nothing new or changed. No change in vision or smell.   She had a negative mammogram on 05/19/15  LMP 07/28/15  Social History: married  ROS: All other systems reviewed and are negative.  Past Medical History Patient Active Problem List   Diagnosis Date Noted  . Breast discharge 08/29/2015  . Healthcare maintenance 03/03/2015  . Abdominal pain, epigastric 07/03/2013  . Menorrhagia 07/03/2013  . Back muscle spasm 05/08/2013  . Asthma with acute exacerbation 12/12/2012  . Allergic rhinitis 12/12/2012  . OBESITY, NOS 03/24/2006  . Asthma, moderate persistent 03/24/2006    Medications- reviewed and updated Current Outpatient Prescriptions  Medication Sig Dispense Refill  . albuterol (VENTOLIN HFA) 108 (90 BASE) MCG/ACT inhaler Inhale 2 puffs into the lungs every 4 (four) hours as needed. For shortness of breath. 8.5 g 1  . Cetirizine HCl 10 MG CAPS Take 1 capsule (10 mg total) by mouth daily. 30 capsule 5  . fluticasone (FLONASE) 50 MCG/ACT nasal spray Place 2 sprays into both nostrils daily. 16 g 2  . ibuprofen (ADVIL,MOTRIN) 600 MG tablet Take 1 tablet (600 mg total) by mouth every 8 (eight) hours as needed. 30 tablet 0  . phenylephrine (NEO-SYNEPHRINE) 1 % nasal spray Place 1 drop into the nose every 6 (six) hours as needed for congestion. 3 days maximum 30 mL 0  . predniSONE (DELTASONE) 50 MG tablet Take 1 tablet (50 mg total) by  mouth daily with breakfast. For 5 days 5 tablet 0   No current facility-administered medications for this visit.     Objective: Office vital signs reviewed. BP (!) 122/55 (BP Location: Left Arm, Patient Position: Sitting, Cuff Size: Large)   Pulse 72   Temp 98.2 F (36.8 C) (Oral)   Ht 5' 1.5" (1.562 m)   Wt 216 lb 3.2 oz (98.1 kg)   LMP 07/28/2015 (Exact Date)   BMI 40.19 kg/m    Physical Examination:  General: Awake, alert, well- nourished, NAD Breast exam: performed with a chaperone. Normal to inspection with no overlying skin abnormalities. No breast nodules noted. Unable to manually express nipple discharge. No axillary lymphadenopathy.   Upreg: negative  Assessment/Plan: Breast discharge Patient presenting with unilateral breast discharge. Given that it is unilateral and was once blood tinged, it is more worrisome. Urine pregnancy negative. No headaches or vision change. Given it is unilateral, I doubt hyperprolactinemia or thyroid dysfunction. Most likely still physiologic but would like to r/o intraductal papilloma vs intraductal carcinoma. - ductogram ordered, although the pt has a mammogram in [place], I was told she would require a diagnostic mammogram and ultrasound as well. - discussed return precautions.   Orders Placed This Encounter  Procedures  . MM Ductogram Unilateral Left    Standing Status:   Future    Standing Expiration Date:   10/28/2016    Order Specific Question:   Reason for Exam (SYMPTOM  OR DIAGNOSIS REQUIRED)  Answer:   Beige/straw colored nipple discharge on the left that was reported blood tinged, associated L breast pain.    Order Specific Question:   Is the patient pregnant?    Answer:   No    Order Specific Question:   Preferred imaging location?    Answer:   [facility]  . MM DIAG BREAST TOMO UNI LEFT    PF:  05/19/2015 BCG/ NO IMPLANTS/ NO HX BR CA/ NO NEEDS INS:  SELF PAY- PT AWARE OF COSTS ($138.00 & 112.00 AT TOS) JTB/[name]     Standing Status:   Future    Standing Expiration Date:   10/28/2016    Order Specific Question:   Reason for Exam (SYMPTOM  OR DIAGNOSIS REQUIRED)    Answer:   L breast pain, straw-colored nipple discharge, reported blood tinged.    Order Specific Question:   Is the patient pregnant?    Answer:   No    Order Specific Question:   Preferred imaging location?    Answer:   [facility]  . US BREAST LTD UNI LEFT INC AXILLA    Standing Status:   Future    Standing Expiration Date:   10/28/2016    Order Specific Question:   Reason for Exam (SYMPTOM  OR DIAGNOSIS REQUIRED)    Answer:   L breast pain, straw-colored nipple discharge, reported blood tinged.    Order Specific Question:   Preferred imaging location?    Answer:   [facility]  . POCT urine pregnancy    No orders of the defined types were placed in this encounter.   Archie Patten PGY-3, [place]

## 2015-08-29 NOTE — Patient Instructions (Signed)
Galactorrhea Galactorrhea is an abnormal milky discharge from the breast. The discharge may come from one or both nipples. The fluid is often white, yellow, or green. It is different from the normal milk produced in nursing mothers. Galactorrhea usually occurs in women, but it can sometimes affect men. Various things can cause galactorrhea. It is often caused by irritation of the breast, which can result from injury, stimulation during sexual activity, or clothes rubbing against the nipple. It may also be related to medicines or changes in hormone levels. In many cases, galactorrhea will go away without treatment. However, galactorrhea can also be a sign of something more serious, such as diseases of the kidney or thyroid or problems with the pituitary gland. Your health care provider may do various tests to help determine the cause. Sometimes the cause is unknown. It is important to monitor your condition to make sure that it goes away. HOME CARE INSTRUCTIONS  Watch your condition for any changes. The following actions may help to lessen any discomfort that you are feeling:  Take medicines only as directed by your health care provider.  Do not squeeze your breasts or nipples.  Avoid breast stimulation during sexual activity.   Perform a breast self-exam once a month. Doing this more often can irritate your breasts.  Avoid clothes that rub on your nipples.  Use breast pads to absorb the discharge.  Wear a breast binder or a support bra to help prevent clothes from rubbing on your nipples.  Keep all follow-up visits as directed by your health care provider. This is important. SEEK MEDICAL CARE IF:  You develop hot flashes, vaginal dryness, or a lack of sexual desire.  You stop having menstrual periods, or they are irregular or far apart.  You have headaches.  You have vision problems. SEEK IMMEDIATE MEDICAL CARE IF:  You have breast discharge that is bloody or puslike.  You have  breast pain.  You feel a lump in your breast.  You have wrinkling or dimpling on your breast.  Your breast becomes red and swollen.   This information is not intended to replace advice given to you by your health care provider. Make sure you discuss any questions you have with your health care provider.   Document Released: 02/19/2004 Document Revised: 02/01/2014 Document Reviewed: 08/14/2013 Elsevier Interactive Patient Education [employer].

## 2015-09-02 ENCOUNTER — Other Ambulatory Visit: Payer: Self-pay | Admitting: Family Medicine

## 2015-09-02 ENCOUNTER — Other Ambulatory Visit: Payer: No Typology Code available for payment source

## 2015-09-02 DIAGNOSIS — J4521 Mild intermittent asthma with (acute) exacerbation: Secondary | ICD-10-CM

## 2015-09-03 ENCOUNTER — Ambulatory Visit: Payer: No Typology Code available for payment source

## 2015-09-03 NOTE — Telephone Encounter (Signed)
No longer a [facility] patient  

## 2015-09-03 NOTE — Telephone Encounter (Signed)
Please have patient schedule a follow up visit for her asthma. Albuterol refilled x1

## 2015-09-09 ENCOUNTER — Ambulatory Visit: Payer: No Typology Code available for payment source

## 2015-10-16 ENCOUNTER — Telehealth: Payer: Self-pay | Admitting: Family Medicine

## 2015-10-16 DIAGNOSIS — Z1231 Encounter for screening mammogram for malignant neoplasm of breast: Secondary | ICD-10-CM

## 2015-10-16 NOTE — Telephone Encounter (Signed)
Pt would like to get another referral to have a mammogram. She did not go in August because there was some confusion about the appt.  Please call pt when appt has been scheduled

## 2015-10-17 NOTE — Telephone Encounter (Signed)
Order for mammogram placed.  [name]. [name], [place] Resident PGY-3 10/17/2015 1:40 PM

## 2015-11-04 ENCOUNTER — Other Ambulatory Visit: Payer: No Typology Code available for payment source

## 2015-11-04 ENCOUNTER — Other Ambulatory Visit (HOSPITAL_COMMUNITY): Payer: Self-pay | Admitting: *Deleted

## 2015-11-04 DIAGNOSIS — N644 Mastodynia: Secondary | ICD-10-CM

## 2015-11-04 DIAGNOSIS — N6452 Nipple discharge: Secondary | ICD-10-CM

## 2015-11-20 ENCOUNTER — Encounter (HOSPITAL_COMMUNITY): Payer: Self-pay

## 2015-11-20 ENCOUNTER — Ambulatory Visit
Admission: RE | Admit: 2015-11-20 | Discharge: 2015-11-20 | Disposition: A | Discharge status: Home / Self Care | Payer: Self-pay | Source: Ambulatory Visit | Attending: Family Medicine | Admitting: Family Medicine

## 2015-11-20 ENCOUNTER — Ambulatory Visit
Admission: RE | Admit: 2015-11-20 | Discharge: 2015-11-20 | Disposition: A | Discharge status: Home / Self Care | Payer: Self-pay | Source: Ambulatory Visit | Attending: Obstetrics and Gynecology | Admitting: Obstetrics and Gynecology

## 2015-11-20 ENCOUNTER — Ambulatory Visit (HOSPITAL_COMMUNITY)
Admission: RE | Admit: 2015-11-20 | Discharge: 2015-11-20 | Disposition: A | Discharge status: Home / Self Care | Payer: Self-pay | Source: Ambulatory Visit | Attending: Obstetrics and Gynecology | Admitting: Obstetrics and Gynecology

## 2015-11-20 VITALS — BP 118/70 | Ht 61.0 in | Wt 219.4 lb

## 2015-11-20 DIAGNOSIS — Z1239 Encounter for other screening for malignant neoplasm of breast: Secondary | ICD-10-CM

## 2015-11-20 DIAGNOSIS — N644 Mastodynia: Secondary | ICD-10-CM

## 2015-11-20 DIAGNOSIS — N6452 Nipple discharge: Secondary | ICD-10-CM

## 2015-11-20 NOTE — Patient Instructions (Signed)
Explained breast self awareness to Rebecca Olson. Patient did not need a Pap smear today due to last Pap smear and HPV typing was completed 03/20/2012. Let her know BCCCP will cover Pap smears and HPV typing every 5 years unless has a history of abnormal Pap smears. Referred patient to the [facility] for a left breast diagnostic mammogram and possible ultrasound. Appointment scheduled for Thursday, November 20, 2015 at 0950. Stormee D Colonna verbalized understanding.  Almee Pelphrey, Arvil [place], RN 11:19 AM

## 2015-11-20 NOTE — Progress Notes (Signed)
Complaints of left breast pain x 2 months that comes and goes. Patient rates pain at a 5 out of 10. Patient complained of spontaneous left milky colored discharge that happened around [place].  Pap Smear: Pap smear not completed today. Last Pap smear was 03/20/2012 at [facility] and normal with negative HPV. Per patient has no history of an abnormal Pap smear. Last Pap smear result is in EPIC.  Physical exam: Breasts Breasts symmetrical. No skin abnormalities bilateral breasts. Bilateral nipple retraction that per patient has always been that way. No nipple discharge bilateral breasts. Unable to express any discharge from the left breast. No lymphadenopathy. No lumps palpated bilateral breasts. Complaints of left outer breast tenderness around 9 o'clock. Referred patient to the [facility] for a left breast diagnostic mammogram and possible ultrasound. Appointment scheduled for Thursday, November 20, 2015 at 0950.        Pelvic/Bimanual No Pap smear completed today since last Pap smear and HPV typing was 03/20/2012. Pap smear not indicated per BCCCP guidelines.   Smoking History: Patient has never smoked.  Patient Navigation: Patient education provided. Access to services provided for patient through [facility] program.

## 2015-11-24 ENCOUNTER — Encounter (HOSPITAL_COMMUNITY): Payer: Self-pay | Admitting: *Deleted

## 2016-02-18 ENCOUNTER — Ambulatory Visit (INDEPENDENT_AMBULATORY_CARE_PROVIDER_SITE_OTHER): Payer: BLUE CROSS/BLUE SHIELD | Admitting: Family Medicine

## 2016-02-18 ENCOUNTER — Encounter: Payer: Self-pay | Admitting: Family Medicine

## 2016-02-18 DIAGNOSIS — S161XXA Strain of muscle, fascia and tendon at neck level, initial encounter: Secondary | ICD-10-CM | POA: Diagnosis not present

## 2016-02-18 MED ORDER — CYCLOBENZAPRINE HCL 5 MG PO TABS: 5.0000 mg | ORAL_TABLET | Freq: Three times a day (TID) | ORAL | 1 refills | Status: DC | PRN

## 2016-02-18 MED ORDER — IBUPROFEN 600 MG PO TABS: 600.0000 mg | ORAL_TABLET | Freq: Three times a day (TID) | ORAL | 1 refills | Status: DC | PRN

## 2016-02-18 NOTE — Patient Instructions (Signed)
Ibuprofen is pain and antiinflammatory.  Start out taking it three times per day regular to calm down the pain and inflammation. Cyclobenzaprine is the muscle relaxer that may make you sleepy.  OK to take two at night.  If it makes you sleepy, you may not want to take during the day. I am treating as an acute strain.  If it seems to be developing into chronic problems, come back and see [place] for more testing.
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
## 2016-04-20 ENCOUNTER — Ambulatory Visit (INDEPENDENT_AMBULATORY_CARE_PROVIDER_SITE_OTHER): Payer: BLUE CROSS/BLUE SHIELD | Admitting: Family Medicine

## 2016-04-20 VITALS — BP 106/70 | HR 82 | Temp 97.9°F | Ht 61.0 in | Wt 205.2 lb

## 2016-04-20 DIAGNOSIS — J4540 Moderate persistent asthma, uncomplicated: Secondary | ICD-10-CM

## 2016-04-20 DIAGNOSIS — E669 Obesity, unspecified: Secondary | ICD-10-CM

## 2016-04-20 MED ORDER — NALTREXONE-BUPROPION HCL ER 8-90 MG PO TB12: ORAL_TABLET | ORAL | 1 refills | Status: DC

## 2016-04-20 MED ORDER — ALBUTEROL SULFATE HFA 108 (90 BASE) MCG/ACT IN AERS: 2.0000 | INHALATION_SPRAY | RESPIRATORY_TRACT | 0 refills | Status: DC | PRN

## 2016-04-20 NOTE — Patient Instructions (Signed)
We will try to contrave. Take as follows:  One tablet once daily in the morning for 1 week; at week 2, increase to 1 tablet twice daily administered in the morning and evening and continue for 1 week; at week 3, increase to 2 tablets in the morning and 1 tablet in the evening and continue for 1 week; at week 4, increase to 2 tablets twice daily administered in the morning and evening and continue for the remainder of the treatment course.   Call Dr Jenne Campus to schedule an appointment.   We will check blood work.  Come back to see me in 1 month.  Take care, Dr [name]

## 2016-04-20 NOTE — Assessment & Plan Note (Signed)
Discussed options with patient and stressed importance of lifestyle modifications. Placed referral to Dr Jenne Campus. Patient still interested in contrave. Discussed side effects including mood swings and suicidal thoughts with patient, however she still wanted to proceed. This Rx was given today. Will check baseline CMET. Follow up in 1 month.

## 2016-04-20 NOTE — Progress Notes (Signed)
    Subjective:  Rebecca Olson is a 46 y.o. female who presents to the [facility] today with a chief complaint of obesity.   HPI:  Obesity Patient currently interested in having pharmacologic therapy for weight loss. She has being trying to lose weight through diet and exercise, though feels like she has plateaued. She has not adhered to a strict work out or diet regimen. Reports that she occasionally does cardio and frequently walks at work. Denies drinking any sugar sweetened beverages.  Asthma Currently using her albuterol inhaler very infrequently. No recent exacerbations. No night time awakenings.   ROS: Per HPI  PMH: Smoking history reviewed.   Objective:  Physical Exam: BP 106/70   Pulse 82   Temp 97.9 F (36.6 C) (Oral)   Ht 5\' 1"  (1.549 m)   Wt 205 lb 3.2 oz (93.1 kg)   LMP 04/12/2016   SpO2 98%   BMI 38.77 kg/m   Gen: NAD, resting comfortably CV: RRR with no murmurs appreciated Pulm: NWOB, CTAB with no crackles, wheezes, or rhonchi GI: Obese, Normal bowel sounds present. Soft, Nontender, Nondistended. MSK: no edema, cyanosis, or clubbing noted Skin: warm, dry Neuro: grossly normal, moves all extremities Psych: Normal affect and thought content  Assessment/Plan:  Obesity Discussed options with patient and stressed importance of lifestyle modifications. Placed referral to Dr Jenne Campus. Patient still interested in contrave. Discussed side effects including mood swings and suicidal thoughts with patient, however she still wanted to proceed. This Rx was given today. Will check baseline CMET. Follow up in 1 month.   Asthma, moderate persistent Well controlled. Albuterol refilled today.   [name]. [name], [name] [facility] Resident PGY-3 04/20/2016 5:07 PM

## 2016-04-20 NOTE — Assessment & Plan Note (Signed)
Well controlled. Albuterol refilled today.

## 2016-04-21 LAB — CMP14+EGFR
ALBUMIN: 4.1 g/dL (ref 3.5–5.5)
ALT: 12 IU/L (ref 0–32)
AST: 18 IU/L (ref 0–40)
Albumin/Globulin Ratio: 1.4 (ref 1.2–2.2)
Alkaline Phosphatase: 98 IU/L (ref 39–117)
BILIRUBIN TOTAL: 0.4 mg/dL (ref 0.0–1.2)
BUN / CREAT RATIO: 18 (ref 9–23)
BUN: 14 mg/dL (ref 6–24)
CO2: 25 mmol/L (ref 18–29)
Calcium: 9.7 mg/dL (ref 8.7–10.2)
Chloride: 97 mmol/L (ref 96–106)
Creatinine, Ser: 0.76 mg/dL (ref 0.57–1.00)
GFR calc Af Amer: 110 mL/min/{1.73_m2} (ref >59)
GFR calc non Af Amer: 95 mL/min/{1.73_m2} (ref >59)
Globulin, Total: 3 g/dL (ref 1.5–4.5)
Glucose: 78 mg/dL (ref 65–99)
Potassium: 4.5 mmol/L (ref 3.5–5.2)
SODIUM: 136 mmol/L (ref 134–144)
Total Protein: 7.1 g/dL (ref 6.0–8.5)

## 2016-04-22 ENCOUNTER — Encounter: Payer: Self-pay | Admitting: Family Medicine

## 2016-07-15 ENCOUNTER — Ambulatory Visit: Payer: BLUE CROSS/BLUE SHIELD | Admitting: Family Medicine

## 2016-08-11 ENCOUNTER — Encounter (HOSPITAL_COMMUNITY): Payer: Self-pay | Admitting: *Deleted

## 2016-08-11 NOTE — Progress Notes (Signed)
Letter mailed to patient [facility]

## 2016-08-20 ENCOUNTER — Ambulatory Visit (INDEPENDENT_AMBULATORY_CARE_PROVIDER_SITE_OTHER): Payer: Self-pay | Admitting: Family Medicine

## 2016-08-20 ENCOUNTER — Encounter: Payer: Self-pay | Admitting: Family Medicine

## 2016-08-20 VITALS — BP 116/72 | HR 74 | Temp 98.4°F | Ht 61.0 in | Wt 194.0 lb

## 2016-08-20 DIAGNOSIS — L299 Pruritus, unspecified: Secondary | ICD-10-CM

## 2016-08-20 MED ORDER — HYDROXYZINE HCL 10 MG PO TABS: 10.0000 mg | ORAL_TABLET | Freq: Three times a day (TID) | ORAL | 0 refills | Status: DC | PRN

## 2016-08-20 MED ORDER — TRIAMCINOLONE ACETONIDE 0.025 % EX OINT: 1.0000 "application " | TOPICAL_OINTMENT | Freq: Two times a day (BID) | CUTANEOUS | 0 refills | Status: DC

## 2016-08-20 NOTE — Progress Notes (Signed)
   Subjective:   Patient ID: Rebecca Olson    DOB: 04/24/1970, 46 y.o. female   MRN: [id number]  CC: "Itching"  HPI: Rebecca Olson is a 46 y.o. female who presents to clinic today for itching. Problems discussed today are as follows:  Itching: Onset 3 weeks ago. Patient denies previous itching in the past like this before. 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 Patient currently on Zyrtec daily for allergies. Has history of eczema. Itching.worse at any particular time of the day. She showers only once daily. She does use scented body washes but has abstained for the past week using Dial soap only. ROS: Denies fevers or chills, right upper quadrant pain, nausea or vomiting, jaundice, pale stools.  Complete ROS performed, see HPI for pertinent.  [place]: Obesity, allergic rhinitis, asthma. Smoking status reviewed. Medications reviewed.  Objective:   BP 116/72   Pulse 74   Temp 98.4 F (36.9 C) (Oral)   Ht 5\' 1"  (1.549 m)   Wt 194 lb (88 kg)   SpO2 100%   BMI 36.66 kg/m  Vitals and nursing note reviewed.  General: well nourished, well developed, in no acute distress with non-toxic appearance CV: regular rate and rhythm without murmurs, rubs, or gallops, no lower extremity edema Lungs: clear to auscultation bilaterally with normal work of breathing Abdomen: soft, non-tender, non-distended, no masses or organomegaly palpable, normoactive bowel sounds Skin: warm, dry, cap refill < 2 seconds, irregular hyperpigmented macular rash on thighs bilaterally Extremities: warm and well perfused, normal tone      Assessment & Plan:   Pruritus Acute. Diffuse with associated upper pigmented irregular macular like rash on thighs bilaterally. Could be bruising secondary to constant itching. Does not appear fungal. No recent exposure to your attendance. Works as a  Quarry manager. --Will give trial of triamcinolone 0.025% to affected area twice daily for 2 weeks --Hydroxyzine 3 times daily as needed for itching --RTC one month if not improved  No orders of the defined types were placed in this encounter.  Meds ordered this encounter  Medications  . triamcinolone (KENALOG) 0.025 % ointment    Sig: Apply 1 application topically 2 (two) times daily.    Dispense:  30 g    Refill:  0  . hydrOXYzine (ATARAX/VISTARIL) 10 MG tablet    Sig: Take 1 tablet (10 mg total) by mouth 3 (three) times daily as needed.    Dispense:  30 tablet    Refill:  0    Harriet Butte, [place], PGY-2 08/20/2016 4:49 PM

## 2016-08-20 NOTE — Assessment & Plan Note (Signed)
Acute. Diffuse with associated upper pigmented irregular macular like rash on thighs bilaterally. Could be bruising secondary to constant itching. Does not appear fungal. No recent exposure to your attendance. Works as a Quarry manager. --Will give trial of triamcinolone 0.025% to affected area twice daily for 2 weeks --Hydroxyzine 3 times daily as needed for itching --RTC one month if not improved

## 2016-08-20 NOTE — Patient Instructions (Signed)
Thank you for coming in to see [place] today. Please see below to review our plan for today's visit.  Apply the triamcinolone ointment to affected areas twice daily as needed for 2 weeks. Make sure you apply this on dry skin. I have also given you prescription of hydroxyzine which she can take up to 3 times daily as needed for itching. I also make efforts to avoid any scented lotions or soaps while using these medications. Try using a gentle body wash like dove.  Return to clinic 1 month if not improved.  Please call the clinic at [phone] if your symptoms worsen or you have any concerns. It was my pleasure to see you. -- Harriet Butte, [place], PGY-2

## 2016-09-17 ENCOUNTER — Ambulatory Visit: Payer: Self-pay | Admitting: Family Medicine

## 2016-09-24 ENCOUNTER — Ambulatory Visit (INDEPENDENT_AMBULATORY_CARE_PROVIDER_SITE_OTHER): Payer: Self-pay | Admitting: Family Medicine

## 2016-09-24 ENCOUNTER — Encounter: Payer: Self-pay | Admitting: Family Medicine

## 2016-09-24 DIAGNOSIS — L299 Pruritus, unspecified: Secondary | ICD-10-CM

## 2016-09-24 MED ORDER — HYDROXYZINE HCL 10 MG PO TABS: 10.0000 mg | ORAL_TABLET | Freq: Three times a day (TID) | ORAL | 0 refills | Status: DC | PRN

## 2016-09-24 MED ORDER — RANITIDINE HCL 150 MG PO CAPS: 150.0000 mg | ORAL_CAPSULE | Freq: Two times a day (BID) | ORAL | 3 refills | Status: DC

## 2016-09-24 NOTE — Progress Notes (Signed)
   CC: generalized itching  HPI Seen ~ 1 month ago for 1 month of generalized itching at that time. Got triamcinolone and atarax. Used cream all over in liberal amounts, exhausted atarax supply (this helped some when taken). Itches more when she wakes up. Back is burning and itching now. [place], [place] (no one else at home to suggest environmental exposure). Tried vicks vapor rub. Steroid cream helped a little. Has had this in the past 2 years ago on her head and neck. No recent changes to soap, detergent, lotion. Switched to black soap and Eucerin lotion several months ago. Does have seasonal allergies, asthma. No hx of eczema. Takes zyrtec every day, uses flonase spray. Other than atarax, no new medications. No family history of liver disease. No weight loss, hair loss, fever, chills.   ROS: denies CP, SOB, abd pain, dysuria, changes in BMs.   CC, SH/smoking status, and VS noted  Objective: BP 116/82   Pulse 92   Temp 98.3 F (36.8 C) (Oral)   Ht '5\' 1"'$  (1.549 m)   Wt 200 lb 3.2 oz (90.8 kg)   LMP 09/15/2016 (Approximate)   SpO2 99%   BMI 37.83 kg/m  Gen: NAD, alert, cooperative, and pleasant overweight female. HEENT: NCAT, EOMI, PERRL CV: RRR, no murmur Resp: CTAB, no wheezes, non-labored Abd: SNTND, BS present, no guarding or organomegaly Ext: No edema, warm Skin: scattered small excoriations over back and volar surfaces of wrists, no rashes, no hives, no skin breakdown. Neuro: Alert and oriented, Speech clear, No gross deficits  Assessment and plan:  Pruritus Generalized pruritis x 2 months. 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 Will get CMP to exclude PBC or liver pathology. Asked patient to increase zyrtec dosing and try zantac as well. Placed dermatology referral.    Orders Placed This Encounter  Procedures  . CMP14+EGFR  . Ambulatory referral to Dermatology    Referral Priority:   Routine    Referral Type:   Consultation    Referral Reason:   Specialty Services Required    Requested Specialty:   Dermatology    Number of Visits Requested:   1    Meds ordered this encounter  Medications  . ranitidine (ZANTAC) 150 MG capsule    Sig: Take 1 capsule (150 mg total) by mouth 2 (two) times daily.    Dispense:  30 capsule    Refill:  3  . hydrOXYzine (ATARAX/VISTARIL) 10 MG tablet    Sig: Take 1 tablet (10 mg total) by mouth 3 (three) times daily as needed.    Dispense:  30 tablet    Refill:  0    Ralene Ok, MD, PGY2 09/29/2016 9:46 AM

## 2016-09-24 NOTE — Patient Instructions (Signed)
It was a pleasure to see you today! Thank you for choosing [facility] for your primary care. Rebecca Olson was seen for generalized itching.   Our plans for today were:  Increase your zyrtec to 1 tab twice per day. If that doesn't seem to help, increase to 2 tabs twice per day.   Start taking the zantac as prescribed.   I placed a dermatology referral.   I will call you if your liver labs are abnormal.   Best,  Dr. Lindell Noe

## 2016-09-25 ENCOUNTER — Encounter: Payer: Self-pay | Admitting: Family Medicine

## 2016-09-25 LAB — CMP14+EGFR
ALT: 16 IU/L (ref 0–32)
AST: 20 IU/L (ref 0–40)
Albumin/Globulin Ratio: 1.7 (ref 1.2–2.2)
Albumin: 4.2 g/dL (ref 3.5–5.5)
Alkaline Phosphatase: 106 IU/L (ref 39–117)
BUN/Creatinine Ratio: 18 (ref 9–23)
BUN: 14 mg/dL (ref 6–24)
Bilirubin Total: 0.4 mg/dL (ref 0.0–1.2)
CALCIUM: 9.4 mg/dL (ref 8.7–10.2)
CO2: 23 mmol/L (ref 20–29)
Chloride: 102 mmol/L (ref 96–106)
Creatinine, Ser: 0.8 mg/dL (ref 0.57–1.00)
GFR calc Af Amer: 102 mL/min/{1.73_m2} (ref >59)
GFR, EST NON AFRICAN AMERICAN: 89 mL/min/{1.73_m2} (ref >59)
GLOBULIN, TOTAL: 2.5 g/dL (ref 1.5–4.5)
Glucose: 104 mg/dL — ABNORMAL HIGH (ref 65–99)
Potassium: 4.3 mmol/L (ref 3.5–5.2)
SODIUM: 141 mmol/L (ref 134–144)
Total Protein: 6.7 g/dL (ref 6.0–8.5)

## 2016-09-29 ENCOUNTER — Encounter: Payer: Self-pay | Admitting: Family Medicine

## 2016-09-29 NOTE — Assessment & Plan Note (Addendum)
Generalized pruritis x 2 months. 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 Will get CMP to exclude PBC or liver pathology. Asked patient to increase zyrtec dosing and try zantac as well. Placed dermatology referral.

## 2016-11-25 ENCOUNTER — Other Ambulatory Visit: Payer: Self-pay | Admitting: Family Medicine

## 2016-11-25 DIAGNOSIS — Z1231 Encounter for screening mammogram for malignant neoplasm of breast: Secondary | ICD-10-CM

## 2016-12-22 ENCOUNTER — Ambulatory Visit
Admission: RE | Admit: 2016-12-22 | Discharge: 2016-12-22 | Disposition: A | Discharge status: Home / Self Care | Payer: BLUE CROSS/BLUE SHIELD | Source: Ambulatory Visit | Attending: Family Medicine | Admitting: Family Medicine

## 2016-12-22 DIAGNOSIS — Z1231 Encounter for screening mammogram for malignant neoplasm of breast: Secondary | ICD-10-CM

## 2017-01-14 ENCOUNTER — Ambulatory Visit: Payer: Self-pay | Admitting: Podiatry

## 2017-01-26 ENCOUNTER — Encounter: Payer: Self-pay | Admitting: Podiatry

## 2017-01-26 ENCOUNTER — Ambulatory Visit: Payer: BLUE CROSS/BLUE SHIELD | Admitting: Family Medicine

## 2017-01-27 ENCOUNTER — Encounter: Payer: Self-pay | Admitting: Family Medicine

## 2017-01-27 ENCOUNTER — Other Ambulatory Visit: Payer: Self-pay

## 2017-01-27 ENCOUNTER — Ambulatory Visit: Payer: BLUE CROSS/BLUE SHIELD | Admitting: Family Medicine

## 2017-01-27 DIAGNOSIS — E669 Obesity, unspecified: Secondary | ICD-10-CM

## 2017-01-27 DIAGNOSIS — J309 Allergic rhinitis, unspecified: Secondary | ICD-10-CM | POA: Diagnosis not present

## 2017-01-27 MED ORDER — ALBUTEROL SULFATE HFA 108 (90 BASE) MCG/ACT IN AERS: 2.0000 | INHALATION_SPRAY | RESPIRATORY_TRACT | 0 refills | Status: DC | PRN

## 2017-01-27 MED ORDER — CETIRIZINE HCL 10 MG PO CAPS: 10.0000 mg | ORAL_CAPSULE | Freq: Every day | ORAL | 5 refills | Status: DC

## 2017-01-27 MED ORDER — NALTREXONE-BUPROPION HCL ER 8-90 MG PO TB12: ORAL_TABLET | ORAL | 1 refills | Status: DC

## 2017-01-27 NOTE — Patient Instructions (Addendum)
Rebecca Olson, you were seen today for a checkup and to discuss continuing your weight loss medication.  I am fine with refilling this, however I want you to follow-up with me in 1 month and we can discuss any possible side effects that have come up.  Additionally, you are due for a Pap smear and we can do that your next follow-up.  Please call Dr. Jenne Campus to make an appointment to discuss your nutrition.   Please call me with any concerns.  Nice to meet you!  Rebecca Olson L. Rosalyn Gess, [place] Resident PGY-2 01/27/2017 3:17 PM

## 2017-01-27 NOTE — Progress Notes (Signed)
    Subjective:  Rebecca Olson is a 47 y.o. female who presents to the [facility] today for refill of her weight loss medication  HPI:  Obesity In April 20, 2016 Dr. [name] started patient on naltrexone- bupropion (contrave) for weight loss.  He gave her enough refills for 2 months and recommended the patient follow-up in 1 month to discuss any possible side effects.  Patient did not follow-up, as she was unaware of recommended follow-up.  She did lose about 10 pounds over the course of couple of months and has since gained this weight back.  She would like to try this medication again.  She has been trying to lose weight with strict workout and diet.  Denies drinking any sugar sweetened beverages and does walk quite a bit at work.    Smoking history reviewed Medication: reviewed and updated ROS: see HPI   Objective:  Physical Exam: BP 110/72   Pulse 64   Temp 97.8 F (36.6 C) (Oral)   Wt 201 lb (91.2 kg)   LMP 12/31/2016   SpO2 99%   BMI 37.98 kg/m   Gen: 47 year old female NAD, resting comfortably CV: RRR with no murmurs appreciated Pulm: NWOB, CTAB with no crackles, wheezes, or rhonchi GI: Normal bowel sounds present. Soft, Nontender, Nondistended. MSK: no edema, cyanosis, or clubbing noted Skin: warm, dry Neuro: grossly normal, moves all extremities Psych: Normal affect and thought content  No results found for this or any previous visit (from the past 72 hour(s)).   Assessment/Plan:  Obesity After discussion with patient will refill contrave with the understanding that patient will call Dr. Jenne Campus and be seen for nutrition evaluation and follow-up with me in 1 month after she starts the medication to discuss any possible side effects and will recheck BMP at that time.  Discussed return precautions.  Recommended that patient call me with any concerns.   Healthcare maintenance Patient is due for Pap smear.  Reports that she will come back and have this done next month and  follow-up for weight loss.   [name] L. Rosalyn Gess, [facility] Resident PGY-2 01/27/2017 3:26 PM

## 2017-01-27 NOTE — Assessment & Plan Note (Addendum)
After discussion with patient will refill contrave with the understanding that patient will call Dr. Jenne Campus and be seen for nutrition evaluation and follow-up with me in 1 month after she starts the medication to discuss any possible side effects and will recheck BMP at that time.  Discussed return precautions.  Recommended that patient call me with any concerns.

## 2017-01-27 NOTE — Progress Notes (Deleted)
    Subjective:  Rebecca Olson is a 47 y.o. female who presents to the [facility] today with a chief complaint of ***.   HPI:  ***  ***  PMH: *** Tobacco use: *** Medication: reviewed and updated ROS: see HPI   Objective:  Physical Exam: BP 110/72   Pulse 64   Temp 97.8 F (36.6 C) (Oral)   Wt 201 lb (91.2 kg)   LMP 12/31/2016   SpO2 99%   BMI 37.98 kg/m   Gen: ***NAD, resting comfortably CV: RRR with no murmurs appreciated Pulm: NWOB, CTAB with no crackles, wheezes, or rhonchi GI: Normal bowel sounds present. Soft, Nontender, Nondistended. MSK: no edema, cyanosis, or clubbing noted Skin: warm, dry Neuro: grossly normal, moves all extremities Psych: Normal affect and thought content  No results found for this or any previous visit (from the past 72 hour(s)).   Assessment/Plan:  No problem-specific Assessment & Plan notes found for this encounter.

## 2017-02-24 ENCOUNTER — Ambulatory Visit: Payer: BLUE CROSS/BLUE SHIELD | Admitting: Family Medicine

## 2017-05-03 ENCOUNTER — Ambulatory Visit: Payer: BLUE CROSS/BLUE SHIELD | Admitting: Internal Medicine

## 2017-05-26 ENCOUNTER — Ambulatory Visit: Payer: BLUE CROSS/BLUE SHIELD | Admitting: Family Medicine

## 2017-05-26 ENCOUNTER — Other Ambulatory Visit: Payer: Self-pay

## 2017-05-26 ENCOUNTER — Other Ambulatory Visit (HOSPITAL_COMMUNITY)
Admission: RE | Admit: 2017-05-26 | Discharge: 2017-05-26 | Disposition: A | Discharge status: Home / Self Care | Payer: BLUE CROSS/BLUE SHIELD | Source: Ambulatory Visit | Attending: Family Medicine | Admitting: Family Medicine

## 2017-05-26 ENCOUNTER — Encounter: Payer: Self-pay | Admitting: Family Medicine

## 2017-05-26 VITALS — BP 102/64 | HR 100 | Temp 98.2°F | Wt 205.0 lb

## 2017-05-26 DIAGNOSIS — Z124 Encounter for screening for malignant neoplasm of cervix: Secondary | ICD-10-CM | POA: Diagnosis not present

## 2017-05-26 DIAGNOSIS — E6609 Other obesity due to excess calories: Secondary | ICD-10-CM

## 2017-05-26 DIAGNOSIS — N644 Mastodynia: Secondary | ICD-10-CM | POA: Diagnosis not present

## 2017-05-26 DIAGNOSIS — Z1151 Encounter for screening for human papillomavirus (HPV): Secondary | ICD-10-CM | POA: Diagnosis not present

## 2017-05-26 MED ORDER — NALTREXONE-BUPROPION HCL ER 8-90 MG PO TB12: ORAL_TABLET | ORAL | 1 refills | Status: DC

## 2017-05-26 NOTE — Patient Instructions (Signed)
We did a pap smear today! It was nice to meet you! See you in 3 months!  Dr. Nuala Alpha

## 2017-05-26 NOTE — Progress Notes (Signed)
Subjective: Chief Complaint  Patient presents with  . Breast Problem     HPI: Rebecca Olson is a 47 y.o. presenting to clinic today to discuss the following:  Pap Smear Patient presents for routine pap smear today. Denies any rash, itching, pain, or abnormal discharge. She states her periods are irregular and have always been so. They sometimes skip a month and last 3-7 days and are described as heavy.   Breast lumps and pain Patient states her left breast sometimes gets painful and she feels hard lumps in her breast. She recently had a bilateral mammogram that showed no suspicious lesions but did have areas of fibroglandular density. Discussed with patient these are likely fibrocystic breast changes associated with menstrual cycle. Advised patient to continue regular mammogram screening.  Refill for Contrave At last appointment with previous PCP patient stated she had worked out that she would start Contrave for weight loss as it had worked for her in the past. Agreed to fill it for her this time and we can discuss other weight loss options for her at next visit.  ROS significant for no fever, chills, nausea, vomiting, or burning on urination.  Endorses abdominal pain, constipation, and low back pain. Abdominal pain is chronic, associated with eating, dull pain, rated 5/10 and non-radiating.  Health Maintenance: pap smear today     ROS noted in HPI.   Past Medical, Surgical, Social, and Family History Reviewed & Updated per EMR.   Pertinent Historical Findings include:   Social History   Tobacco Use  Smoking Status Never Smoker  Smokeless Tobacco Never Used   Objective: BP 102/64   Pulse 100   Temp 98.2 F (36.8 C) (Oral)   Wt 205 lb (93 kg)   LMP 03/29/2017   SpO2 98%   BMI 38.73 kg/m  Vitals and nursing notes reviewed  Physical Exam  Constitutional: She is oriented to person, place, and time. She appears well-developed and well-nourished. No distress.    HENT:  Head: Normocephalic and atraumatic.  Eyes: Pupils are equal, round, and reactive to light. EOM are normal.  Neck: Normal range of motion.  Cardiovascular: Normal rate, regular rhythm, normal heart sounds and intact distal pulses.  No murmur heard. Pulmonary/Chest: Effort normal and breath sounds normal. No respiratory distress. She has no wheezes. She exhibits no tenderness. Left breast exhibits tenderness. Left breast exhibits no inverted nipple, no mass, no nipple discharge and no skin change.  Abdominal: Soft. Bowel sounds are normal. She exhibits no distension. There is no tenderness. There is no guarding.  Genitourinary: Vagina normal and uterus normal. No vaginal discharge found.  Musculoskeletal: Normal range of motion.  Neurological: She is alert and oriented to person, place, and time.  Skin: Skin is warm. Capillary refill takes less than 2 seconds. No rash noted. No erythema.    No results found for this or any previous visit (from the past 72 hour(s)).  Assessment/Plan:  Screening for cervical cancer Routine pap, will notify patient of results. Will call if abnormal.  Breast pain, left Breast exam normal and recent mammogram showed no suspicious lesions which is all very reassuring.   Patient will continue to get routine mammogram   Obesity Refilled Contrave after discussing with patient. Patient will return for follow up in 3 months.   PATIENT EDUCATION PROVIDED: See AVS    Diagnosis and plan along with any newly prescribed medication(s) were discussed in detail with this patient today. The patient verbalized understanding  and agreed with the plan. Patient advised if symptoms worsen return to clinic or ER.   Health Maintainance:  No orders of the defined types were placed in this encounter.   Meds ordered this encounter  Medications  . Naltrexone-buPROPion HCl ER (CONTRAVE) 8-90 MG TB12    Sig: Take 1 tabs qd for a week, then 1 tabs bid for a week, then  2 tabs qam and 1qpm for a week, then 2 tabs bid    Dispense:  120 tablet    Refill:  [place], DO 05/30/2017, 11:53 PM PGY-1, [place]

## 2017-05-30 DIAGNOSIS — Z124 Encounter for screening for malignant neoplasm of cervix: Secondary | ICD-10-CM | POA: Insufficient documentation

## 2017-05-30 DIAGNOSIS — N644 Mastodynia: Secondary | ICD-10-CM | POA: Insufficient documentation

## 2017-05-30 NOTE — Assessment & Plan Note (Signed)
Routine pap, will notify patient of results. Will call if abnormal.

## 2017-05-31 LAB — CYTOLOGY - PAP
DIAGNOSIS: NEGATIVE
HPV: NOT DETECTED

## 2017-05-31 NOTE — Assessment & Plan Note (Signed)
Breast exam normal and recent mammogram showed no suspicious lesions which is all very reassuring.   Patient will continue to get routine mammogram

## 2017-05-31 NOTE — Assessment & Plan Note (Signed)
Refilled Contrave after discussing with patient. Patient will return for follow up in 3 months.

## 2017-06-01 ENCOUNTER — Encounter: Payer: Self-pay | Admitting: Family Medicine

## 2017-06-01 NOTE — Progress Notes (Signed)
Printing letter of results and will mail to patient

## 2017-06-09 NOTE — Progress Notes (Signed)
This encounter was created in error - please disregard.

## 2018-04-11 ENCOUNTER — Other Ambulatory Visit: Payer: Self-pay

## 2018-04-11 ENCOUNTER — Encounter: Payer: Self-pay | Admitting: Family Medicine

## 2018-04-11 ENCOUNTER — Ambulatory Visit (INDEPENDENT_AMBULATORY_CARE_PROVIDER_SITE_OTHER): Payer: Self-pay | Admitting: Family Medicine

## 2018-04-11 VITALS — BP 120/80 | HR 65 | Temp 98.8°F | Ht 61.0 in | Wt 208.0 lb

## 2018-04-11 DIAGNOSIS — J301 Allergic rhinitis due to pollen: Secondary | ICD-10-CM

## 2018-04-11 MED ORDER — FLUTICASONE PROPIONATE 50 MCG/ACT NA SUSP: 2.0000 | Freq: Every day | NASAL | 2 refills | Status: DC

## 2018-04-11 NOTE — Patient Instructions (Signed)
   It was wonderful to see you today.  Thank you for choosing [facility].   Please call [phone] with any questions about today's appointment.  Please be sure to schedule follow up at the front  desk before you leave today.   Dorris Singh, MD  Family Medicine    Call if you develop fevers or worsening symptoms.   Stop Cetirizine D- Start Claritin  Flonase- 2 sprays daily

## 2018-04-11 NOTE — Progress Notes (Signed)
  Patient Name: Billy D Lanigan Date of Birth: 1970-08-30 Date of Visit: 04/11/18 PCP: Nuala Alpha, DO  Chief Complaint: right and left ear pain   Subjective: Cloie D Massaro is a pleasant 48 y.o. with medical history significant for seasonal allergies asthma and atopic dermatitis presenting today with a week of sinus congestion.  The patient reports sniffing and clear nasal congestion for the last week.  Her most bothersome symptoms is bilateral ear pain.  This goes in the left ear to the right ear.  She has no associated tinnitus, hearing loss, fevers, nausea, vomiting, difficulty breathing or cough.  She does have a sore throat.  The patient attributes her sore throat to postnasal drip.  She reports her symptoms are sometimes worse at night.  She uses Zyrtec year-round she has recently started using Zyrtec with a decongestant component.  She uses Flonase intermittently throughout the year.  She finds her allergy is very bothersome    ROS: As above ROS  I have reviewed the patient's medical, surgical, family, and social history as appropriate.   Vitals:   04/11/18 1111  BP: 120/80  Pulse: 65  Temp: 98.8 F (37.1 C)  SpO2: 99%   Filed Weights   04/11/18 1111  Weight: 208 lb (94.3 kg)   HEENT: Sclera anicteric. Dentition is moderate. Appears well hydrated.  Bilateral TMs are visualized there is moderate soft cerumen in the external auditory canals.  Tympanic membranes are clear without bulging or erythema Significant nasal drainage mild pain along right maxillary sinus Neck: Supple no lymphadenopathy Cardiac: Regular rate and rhythm. Normal S1/S2. No murmurs, rubs, or gallops appreciated. Lungs: Clear bilaterally to ascultation.  Abdomen: Normoactive bowel sounds. No tenderness to deep or light palpation. No rebound or guarding.  Extremities: Warm, well perfused without edema.  Skin: Warm, dry Psych: Pleasant and appropriate     Shyna was seen today for sinus problem.   Diagnoses and all orders for this visit:  Seasonal allergic rhinitis due to pollen -     fluticasone (FLONASE) 50 MCG/ACT nasal spray; Place 2 sprays into both nostrils daily. -     Ambulatory referral to Allergy as patient has had difficulty controlling symptoms. We did discuss that intranasal steroids should come first and foremost. I recommended she rotate her anti histamines every 3 months. Given her asthma, atopy, and allergies she may benefit from further evaluation by Allergy.     Dorris Singh, MD  Family Medicine Teaching Service

## 2018-05-24 ENCOUNTER — Encounter: Payer: Self-pay | Admitting: Allergy

## 2018-05-24 ENCOUNTER — Other Ambulatory Visit: Payer: Self-pay

## 2018-05-24 ENCOUNTER — Ambulatory Visit: Payer: Self-pay | Admitting: Allergy

## 2018-05-24 VITALS — BP 120/72 | HR 94 | Temp 98.0°F | Resp 16 | Ht 61.81 in | Wt 203.2 lb

## 2018-05-24 DIAGNOSIS — H1013 Acute atopic conjunctivitis, bilateral: Secondary | ICD-10-CM

## 2018-05-24 DIAGNOSIS — J3089 Other allergic rhinitis: Secondary | ICD-10-CM

## 2018-05-24 DIAGNOSIS — J4520 Mild intermittent asthma, uncomplicated: Secondary | ICD-10-CM

## 2018-05-24 MED ORDER — LEVOCETIRIZINE DIHYDROCHLORIDE 5 MG PO TABS: 5.0000 mg | ORAL_TABLET | Freq: Every evening | ORAL | 5 refills | Status: DC

## 2018-05-24 MED ORDER — AZELASTINE HCL 0.1 % NA SOLN: 2.0000 | Freq: Two times a day (BID) | NASAL | 5 refills | Status: DC

## 2018-05-24 MED ORDER — MONTELUKAST SODIUM 10 MG PO TABS: 10.0000 mg | ORAL_TABLET | Freq: Every day | ORAL | 5 refills | Status: DC

## 2018-05-24 NOTE — Progress Notes (Signed)
New Patient Note  RE: Rebecca Olson MRN: [id number] DOB: 05/23/1970 Date of Office Visit: 05/24/2018  Referring provider: Martyn Malay, MD Primary care provider: Nuala Alpha, DO  Chief Complaint: allergies  History of present illness: Rebecca Olson is a 48 y.o. female presenting today for consultation for allergies and asthma.    She reports symptoms of sneezing, nasal drainage with PND and throat clearing, itching ears and plugged ears, itchy/watery eyes and frontal HA.  She also feels that her allergies worsen her asthma and reports coughing, wheezing and chest pain.  She also reports generalized itch without rash. Symptoms are year-round.   She takes zyrtec now for over year and does take it daily at this time.  She has tried Human resources officer in the past when it was prescription.  She has tried flonase which provided some relief.  She has also used decongestants as well (ones behind the counter she reports). She reports using an OTC eye drop.  She states her eye doctor has prescribed an eye drop previously.  In regards to the itching she has not changed her soaps, lotions or detergents or other body products.    She has asthma diagnosed in childhood.  She states it has been a long time (at last 5 years) since she has required systemic steroids. She has an albuterol inhaler but has not used this in about 2 years and believes it is expired.    She denies history of eczema or food allergy. She states she did have allergy testing done when she was a teen and recalls being positive to several items but does not remember exactly what was positive.  Review of systems: Review of Systems  Constitutional: Negative for chills, fever and malaise/fatigue.  HENT: Positive for congestion. Negative for ear discharge, ear pain, nosebleeds, sinus pain and sore throat.   Eyes: Negative for pain, discharge and redness.  Respiratory: Positive for cough. Negative for shortness of breath and wheezing.    Cardiovascular: Positive for chest pain. Negative for palpitations.  Gastrointestinal: Negative for abdominal pain, constipation, diarrhea, heartburn, nausea and vomiting.  Musculoskeletal: Negative for joint pain.  Skin: Negative for itching and rash.  Neurological: Negative for headaches.    All other systems negative unless noted above in HPI  Past medical history: Past Medical History:  Diagnosis Date  . Allergy   . Asthma   . Constipation     Past surgical history: Past Surgical History:  Procedure Laterality Date  . CARPAL TUNNEL WITH CUBITAL TUNNEL      Family history:  Family History  Problem Relation Age of Onset  . Diabetes Mother   . Asthma Mother   . Hypertension Mother   . Allergic rhinitis Mother   . Cancer Father        pancreatic ca  . Healthy Brother   . Angioedema Neg Hx   . Atopy Neg Hx   . Eczema Neg Hx   . Immunodeficiency Neg Hx   . Urticaria Neg Hx     Social history: Lives in an apartment with electric heating and central cooling.  No pets in the home.  No concern for water damage, mildew or roaches in the home.  She is a [employer]ar[employer] manager.  She denies a smoking history.  Medication List: Allergies as of 05/24/2018   No Known Allergies     Medication List       Accurate as of Tashina 29, 2020 11:59 PM. Always use your most recent  med list.        albuterol 108 (90 Base) MCG/ACT inhaler Commonly known as:  Ventolin HFA Inhale 2 puffs into the lungs every 4 (four) hours as needed. For shortness of breath.   azelastine 0.1 % nasal spray Commonly known as:  ASTELIN Place 2 sprays into both nostrils 2 (two) times daily.   Cetirizine HCl 10 MG Caps Take 1 capsule (10 mg total) by mouth daily.   fluticasone 50 MCG/ACT nasal spray Commonly known as:  FLONASE Place 2 sprays into both nostrils daily.   hydrOXYzine 10 MG tablet Commonly known as:  ATARAX/VISTARIL Take 1 tablet (10 mg total) by mouth 3 (three) times daily as needed.    levocetirizine 5 MG tablet Commonly known as:  XYZAL Take 1 tablet (5 mg total) by mouth every evening.   montelukast 10 MG tablet Commonly known as:  SINGULAIR Take 1 tablet (10 mg total) by mouth at bedtime.   Naltrexone-buPROPion HCl ER 8-90 MG Tb12 Commonly known as:  Contrave Take 1 tabs qd for a week, then 1 tabs bid for a week, then 2 tabs qam and 1qpm for a week, then 2 tabs bid   triamcinolone 0.025 % ointment Commonly known as:  KENALOG Apply 1 application topically 2 (two) times daily.       Known medication allergies: No Known Allergies   Physical examination: Blood pressure 120/72, pulse 94, temperature 98 F (36.7 C), temperature source Tympanic, resp. rate 16, height 5' 1.81" (1.57 m), weight 203 lb 3.2 oz (92.2 kg), last menstrual period 05/19/2018, SpO2 97 %.  General: Alert, interactive, in no acute distress. HEENT: TMs pearly gray, turbinates moderately edematous with clear discharge, post-pharynx non erythematous. Neck: Supple without lymphadenopathy. Lungs: Clear to auscultation without wheezing, rhonchi or rales. {no increased work of breathing. CV: Normal S1, S2 without murmurs. Abdomen: Nondistended, nontender. Skin: Warm and dry, without lesions or rashes. Extremities:  No clubbing, cyanosis or edema. Neuro:   Grossly intact.  Diagnositics/Labs: Allergy testing: Environmental allergy skin prick testing is positive to dust mites, cat and cockroach. Intradermal testing is negative.  Allergy testing results were read and interpreted by provider, documented by clinical staff.   Assessment and plan:   Allergic rhinitis with conjunctivitis - environmental allergy skin testing is positive to dust mites, cat and cockroach - allergen avoidance measures discussed/handouts provided - change Zyrtec to Xyzal '5mg'$  daily - start Singulair '10mg'$  daily - take at bedtime - recommend use of nasal saline rinses - provided with rinse kit today.  Use with  distilled water at room temp or boil water and bring down to warm-room temp before use.  Use prior to use of medication nasal sprays - for nasal drainage/post-nasal drip recommend use of nasal antihistamine, Astelin 1-2 sprays each nostril twice a day - for nasal congestion recommend use of nasal steroid spray like OTC Flonase, Nasacort or Rhinocort 2 sprays each nostril daily as needed. Use for 1-2 weeks at a time before stopping once symptoms improve.  - allergen immunotherapy discussed today including protocol, benefits and risk.  Informational handout provided.  If interested in this therapuetic option you can check with your insurance carrier for coverage.  Let us know if you would like to proceed with this option.    Asthma, mild intermittent - have access to albuterol inhaler 2 puffs every 4-6 hours as needed for cough/wheeze/shortness of breath/chest tightness.  May use 15-20 minutes prior to activity.   Monitor frequency of use.   -  singulair as above   Follow-up 4 months or sooner if needed  I appreciate the opportunity to take part in Kanna's care. Please do not hesitate to contact me with questions.  Sincerely,   Prudy Feeler, MD Allergy/Immunology [facility]

## 2018-05-24 NOTE — Patient Instructions (Addendum)
Allergies - environmental allergy skin testing is positive to dust mites, cat and cockroach - allergen avoidance measures discussed/handouts provided - change Zyrtec to Xyzal '5mg'$  daily - start Singulair '10mg'$  daily - take at bedtime - recommend use of nasal saline rinses - provided with rinse kit today.  Use with distilled water at room temp or boil water and bring down to warm-room temp before use.  Use prior to use of medication nasal sprays - for nasal drainage/post-nasal drip recommend use of nasal antihistamine, Astelin 1-2 sprays each nostril twice a day - for nasal congestion recommend use of nasal steroid spray like OTC Flonase, Nasacort or Rhinocort 2 sprays each nostril daily as needed. Use for 1-2 weeks at a time before stopping once symptoms improve.  - allergen immunotherapy discussed today including protocol, benefits and risk.  Informational handout provided.  If interested in this therapuetic option you can check with your insurance carrier for coverage.  Let us know if you would like to proceed with this option.    Asthma - have access to albuterol inhaler 2 puffs every 4-6 hours as needed for cough/wheeze/shortness of breath/chest tightness.  May use 15-20 minutes prior to activity.   Monitor frequency of use.   - singulair as above   Follow-up 4 months or sooner if needed

## 2018-05-26 ENCOUNTER — Telehealth: Payer: Self-pay

## 2018-05-26 NOTE — Telephone Encounter (Signed)
Patient called in with complaints of new onset red/itchy bumps where she had intradermal allergy testing. I spoke with Dr. Nelva Bush and she agreed with recommendation of ice packs, hydrocortisone cream and medications that were prescribed on Wednesday. Patient verbalized understanding of the recommendations and will call back if still better in 5 days.

## 2018-05-30 ENCOUNTER — Other Ambulatory Visit: Payer: Self-pay

## 2018-05-30 MED ORDER — ALBUTEROL SULFATE HFA 108 (90 BASE) MCG/ACT IN AERS: 2.0000 | INHALATION_SPRAY | RESPIRATORY_TRACT | 0 refills | Status: DC | PRN

## 2018-10-27 ENCOUNTER — Other Ambulatory Visit: Payer: Self-pay | Admitting: Family Medicine

## 2018-10-27 DIAGNOSIS — Z1231 Encounter for screening mammogram for malignant neoplasm of breast: Secondary | ICD-10-CM

## 2018-11-02 ENCOUNTER — Ambulatory Visit: Payer: Self-pay | Admitting: Family Medicine

## 2018-12-25 ENCOUNTER — Other Ambulatory Visit: Payer: Self-pay | Admitting: Family Medicine

## 2018-12-25 DIAGNOSIS — Z1231 Encounter for screening mammogram for malignant neoplasm of breast: Secondary | ICD-10-CM

## 2019-01-03 ENCOUNTER — Ambulatory Visit (INDEPENDENT_AMBULATORY_CARE_PROVIDER_SITE_OTHER): Payer: Self-pay | Admitting: Family Medicine

## 2019-01-03 ENCOUNTER — Other Ambulatory Visit: Payer: Self-pay

## 2019-01-03 VITALS — BP 104/60 | Ht 61.0 in | Wt 211.4 lb

## 2019-01-03 DIAGNOSIS — N939 Abnormal uterine and vaginal bleeding, unspecified: Secondary | ICD-10-CM

## 2019-01-03 DIAGNOSIS — N921 Excessive and frequent menstruation with irregular cycle: Secondary | ICD-10-CM

## 2019-01-03 LAB — POCT WET PREP (WET MOUNT)
Clue Cells Wet Prep Whiff POC: NEGATIVE
Trichomonas Wet Prep HPF POC: ABSENT

## 2019-01-03 NOTE — Patient Instructions (Signed)
It was great to see you!  Our plans for today:  - Your wet prep did not show any signs of infection.** - We are getting an ultrasound to look for possible causes of your bleeding.  We are checking some labs today, we will call you or send you a letter if they are abnormal.   Take care and seek immediate care sooner if you develop any concerns.   Dr. Johns[facility]

## 2019-01-03 NOTE — Progress Notes (Signed)
  Subjective:   Patient ID: Rebecca Olson    DOB: 1970/09/23, 48 y.o. female   MRN: [id number]  Rebecca Olson is a 48 y.o. female with a history of asthma, allergic rhinitis, obesity, menorrhagia here for   VAGINAL BLEEDING  Having vaginal bleeding for >2 months. Bleeding is: heavy, soaking through overnight pads. Had to wear depends today. Sex in last month: married and sexually active but none lately b/c of bleeding. Possible STD exposure:no Family history of uterine or vaginal cancer: no  Periods are irregular, normally 3 days with normal flow. She has not yet been through menopause.  Has had heavy bleeding previously as well. She has previously had a Mirena IUD for birth control, but did not have difficulties with bleeding at this time.  She is not currently on birth control. She is G5 P2-0-3-2. Last hemoglobin within normal limits.  Symptoms Weight loss:no Weight gain:no Trouble with vision: no Headaches: yes Dysuria: no Abdomen or pelvic pain: no Back pain: no Genital sores or ulcers:no Pain during sex: no Denies abnormal vaginal discharge, chest pain, shortness of breath.  Review of Systems:  Per HPI.  Medications and smoking status reviewed.  Objective:   BP 104/60   Ht 5\' 1"  (1.549 m)   Wt 211 lb 6 oz (95.9 kg)   LMP 06/26/2018   BMI 39.94 kg/m  Vitals and nursing note reviewed.  General: Morbidly obese female, in no acute distress with non-toxic appearance CV: regular rate and rhythm without murmurs, rubs, or gallops Lungs: clear to auscultation bilaterally with normal work of breathing GYN:  External genitalia within normal limits.  Vaginal mucosa pink, moist, normal rugae.  Nonfriable cervix without lesions.  Moderate bleeding on speculum exam.  No abnormal discharge appreciated. Bimanual exam revealed normal, nongravid uterus.  No cervical motion tenderness. No adnexal masses bilaterally.    Assessment & Plan:   Abnormal uterine bleeding With 36-month  history of continuous heavy bleeding, unclear etiology.  Wet prep negative for infection.  GC/CT not obtained due to lack of infectious symptoms and lack of concern for STIs.  Will obtain CBC to monitor hemoglobin.  We will also obtain pelvic ultrasound to assess for masses or fibroids and call with results.  Is not yet menopausal so no current indication for endometrial biopsy.  Would consider obtaining TSH at follow-up though previously within normal limits.  Would likely benefit from IUD placement, will have patient try scheduled NSAIDs until pelvic ultrasound results.  Orders Placed This Encounter  Procedures  . US Pelvic Complete With Transvaginal    Standing Status:   Future    Standing Expiration Date:   03/05/2020    Order Specific Question:   Reason for Exam (SYMPTOM  OR DIAGNOSIS REQUIRED)    Answer:   AUB    Order Specific Question:   Preferred imaging location?    Answer:   [facility] Outpatient Ultrasound  . CBC  . POCT Wet Prep [facility])   No orders of the defined types were placed in this encounter.   Rory Percy, DO PGY-3, [facility] 01/04/2019 12:00 PM

## 2019-01-04 DIAGNOSIS — N939 Abnormal uterine and vaginal bleeding, unspecified: Secondary | ICD-10-CM | POA: Insufficient documentation

## 2019-01-04 LAB — CBC
Hematocrit: 35.9 % (ref 34.0–46.6)
Hemoglobin: 12.1 g/dL (ref 11.1–15.9)
MCH: 23.8 pg — ABNORMAL LOW (ref 26.6–33.0)
MCHC: 33.7 g/dL (ref 31.5–35.7)
MCV: 71 fL — ABNORMAL LOW (ref 79–97)
Platelets: 379 10*3/uL (ref 150–450)
RBC: 5.08 x10E6/uL (ref 3.77–5.28)
RDW: 16.1 % — ABNORMAL HIGH (ref 11.7–15.4)
WBC: 11.6 10*3/uL — ABNORMAL HIGH (ref 3.4–10.8)

## 2019-01-04 NOTE — Assessment & Plan Note (Addendum)
With 51-month history of continuous heavy bleeding, unclear etiology.  Wet prep negative for infection.  GC/CT not obtained due to lack of infectious symptoms and lack of concern for STIs.  Will obtain CBC to monitor hemoglobin.  We will also obtain pelvic ultrasound to assess for masses or fibroids and call with results.  Is not yet menopausal so no current indication for endometrial biopsy.  Would consider obtaining TSH at follow-up though previously within normal limits.  Would likely benefit from IUD placement, will have patient try scheduled NSAIDs until pelvic ultrasound results.

## 2019-01-15 ENCOUNTER — Ambulatory Visit (HOSPITAL_COMMUNITY): Payer: No Typology Code available for payment source

## 2019-02-14 ENCOUNTER — Ambulatory Visit: Payer: Self-pay

## 2019-03-22 ENCOUNTER — Ambulatory Visit: Payer: Self-pay

## 2019-04-17 ENCOUNTER — Ambulatory Visit: Payer: No Typology Code available for payment source | Admitting: Family Medicine

## 2019-04-24 ENCOUNTER — Ambulatory Visit
Admission: RE | Admit: 2019-04-24 | Discharge: 2019-04-24 | Disposition: A | Discharge status: Home / Self Care | Payer: No Typology Code available for payment source | Source: Ambulatory Visit | Attending: *Deleted | Admitting: *Deleted

## 2019-04-24 ENCOUNTER — Other Ambulatory Visit: Payer: Self-pay

## 2019-04-24 DIAGNOSIS — Z1231 Encounter for screening mammogram for malignant neoplasm of breast: Secondary | ICD-10-CM

## 2019-06-04 ENCOUNTER — Other Ambulatory Visit: Payer: Self-pay | Admitting: Allergy

## 2019-06-11 ENCOUNTER — Other Ambulatory Visit: Payer: Self-pay | Admitting: Allergy

## 2019-06-29 ENCOUNTER — Other Ambulatory Visit: Payer: Self-pay | Admitting: Allergy

## 2019-06-29 ENCOUNTER — Encounter: Payer: Self-pay | Admitting: Family Medicine

## 2019-06-29 ENCOUNTER — Other Ambulatory Visit: Payer: Self-pay

## 2019-06-29 ENCOUNTER — Ambulatory Visit (INDEPENDENT_AMBULATORY_CARE_PROVIDER_SITE_OTHER): Payer: No Typology Code available for payment source | Admitting: Family Medicine

## 2019-06-29 VITALS — BP 100/64 | HR 80 | Ht 61.0 in | Wt 208.6 lb

## 2019-06-29 DIAGNOSIS — L669 Cicatricial alopecia, unspecified: Secondary | ICD-10-CM

## 2019-06-29 NOTE — Progress Notes (Signed)
    SUBJECTIVE:   CHIEF COMPLAINT / HPI:   Allopecia  Goes to [facility]. The doctor there said there was nothing they could do about a patch on the crown of her head and diagnosed her with CCC alopecia.  Patient is requesting a referral to dermatology for further treatment.  OBJECTIVE:   BP 100/64   Pulse 80   Ht 5\' 1"  (1.549 m)   Wt 208 lb 9.6 oz (94.6 kg)   LMP 03/30/2019 (Approximate)   SpO2 100%   BMI 39.41 kg/m   General: Well-appearing female, no acute distress   ASSESSMENT/PLAN:   Central centrifugal scarring alopecia Referral to dermatology as requested.  Patient to check back in early next week if she has not heard anything just yet.     Wilber Oliphant, MD [place]

## 2019-06-29 NOTE — Assessment & Plan Note (Signed)
Referral to dermatology as requested.  Patient to check back in early next week if she has not heard anything just yet.

## 2019-07-18 ENCOUNTER — Encounter: Payer: Self-pay | Admitting: Family Medicine

## 2019-07-18 ENCOUNTER — Ambulatory Visit (INDEPENDENT_AMBULATORY_CARE_PROVIDER_SITE_OTHER): Payer: No Typology Code available for payment source | Admitting: Family Medicine

## 2019-07-18 ENCOUNTER — Other Ambulatory Visit: Payer: Self-pay

## 2019-07-18 VITALS — BP 110/70 | HR 54 | Ht 61.0 in | Wt 209.0 lb

## 2019-07-18 DIAGNOSIS — R21 Rash and other nonspecific skin eruption: Secondary | ICD-10-CM

## 2019-07-18 MED ORDER — HYDROCORTISONE 2.5 % EX OINT: TOPICAL_OINTMENT | Freq: Two times a day (BID) | CUTANEOUS | 0 refills | Status: DC

## 2019-07-18 NOTE — Patient Instructions (Signed)
It was great to see you!  Our plans for today:  - Use the ointment twice daily as needed for itching.  - If the rash spreads or is not controlled with the ointment or if you develop fevers, bleeding, or discharge, come back to be seen.  Take care and seek immediate care sooner if you develop any concerns.   Dr. [facility]

## 2019-07-18 NOTE — Progress Notes (Signed)
   SUBJECTIVE:   CHIEF COMPLAINT / HPI:   RASH - noticed itchy red bumps to finger, jaw, chest. - has tried heat compresses without relief. Benadryl cream helps a little.   New medications or antibiotics: no Tick, Insect or new pet exposure: outside on Monday with client. No new pets. Recent travel: yes, [place].  New detergent or soap: no Immunocompromised: no  Symptoms Itching: yes Pain over rash: no Feeling ill all over: no Fever: no Mouth sores: no Face or tongue swelling: no Trouble breathing: no Joint swelling or pain: no  PERTINENT  PMH / PSH: Moderate persistent asthma, ADD, obesity  OBJECTIVE:   BP 110/70   Pulse (!) 54   Ht 5\' 1"  (1.549 m)   Wt 209 lb (94.8 kg)   SpO2 98%   BMI 39.49 kg/m   Gen: well appearing, in NAD Skin: scattered erythematous nodules to R side neck, lower face, upper chest.  ASSESSMENT/PLAN:   Rash Rash consistent with insect bites, likely sustained after being outside on Monday. No findings concerning for overlying infection, ticke borne illness, drug reaction. Symptomatic care discussed. Will send Rx topical corticosteroid for itch relief. Continue cetirizine prn.    Rory Percy, [place]

## 2019-07-20 NOTE — Assessment & Plan Note (Signed)
Rash consistent with insect bites, likely sustained after being outside on Monday. No findings concerning for overlying infection, ticke borne illness, drug reaction. Symptomatic care discussed. Will send Rx topical corticosteroid for itch relief. Continue cetirizine prn.

## 2019-08-09 ENCOUNTER — Ambulatory Visit: Payer: No Typology Code available for payment source | Admitting: Family Medicine

## 2019-08-09 NOTE — Progress Notes (Deleted)
    SUBJECTIVE:   CHIEF COMPLAINT / HPI:   ***  PERTINENT  PMH / PSH: ***  OBJECTIVE:   There were no vitals taken for this visit.   Physical Exam: *** General: 49 y.o. female in NAD Cardio: RRR no m/r/g Lungs: CTAB, no wheezing, no rhonchi, no crackles, no IWOB on *** Abdomen: Soft, non-tender to palpation, non-distended, positive bowel sounds Skin: warm and dry Extremities: No edema   ASSESSMENT/PLAN:   No problem-specific Assessment & Plan notes found for this encounter.     Cleophas Dunker, [place]

## 2019-08-14 ENCOUNTER — Other Ambulatory Visit: Payer: Self-pay | Admitting: Family Medicine

## 2019-08-14 NOTE — Patient Instructions (Signed)
Thank you for coming to see me today. It was a pleasure.   [facility] [address], [place], [place] [address]  Referral sent to GI for colonoscopy. Reordered Singulair and Xyzal.  Follow-up with allergist for allergy shots.  I will look into your dermatology referral.  As of now I did schedule for October 2021.   Please follow-up with PCP as needed  If you have any questions or concerns, please do not hesitate to call the office at [phone].  Best,   Carollee Leitz, MD [facility]

## 2019-08-14 NOTE — Progress Notes (Signed)
    SUBJECTIVE:   CHIEF COMPLAINT / HPI: wanting to meet PCP, check about derm consult and prescription refill  Weight  Patient reports difficulty loosing weight.  Reports that she exercises regularly but has not seen any decrease in weight. Has used Contrave in the past without much success.    Allopecia Requesting derm referral. Per chart review there are two appointments for Dermatology in Oct of this year and Jan of 22.  Asthma Reports needing refills for singulair and xyzal.  States that she thinks she needs to follow up with an allergist to get shots.   PERTINENT  PMH / PSH:  Asthma Seasonal Allergies   OBJECTIVE:   BP 100/80   Pulse 89   Ht 5' 1.5" (1.562 m)   Wt 209 lb 3.2 oz (94.9 kg)   SpO2 100%   BMI 38.89 kg/m    General: Alert and oriented, no apparent distress  Cardiovascular: RRR with no murmurs noted Respiratory: CTA bilaterally  Gastrointestinal: Soft, non distended and no abdominal pain, BS present MSK: Upper extremity strength 5/5 bilaterally, Lower extremity strength 5/5 bilaterally  Psych: Behavior and speech appropriate to situation  ASSESSMENT/PLAN:   Obesity Discussed lifestyle changes. -Refer to medical nutrition -Information provided for Weight loss clinic -HbA1C, BMP and Lipid panel today -follow up as needed  Asthma, moderate persistent Stable and currently not symptomatic. -Refill Singulair and Xyzol   Central centrifugal scarring alopecia Patient has two appointments with dermatology scheduled. -Will follow up as patient was not made aware of appointment -Follow up as needed  Healthcare maintenance PAP due 2022 Refer GI for colonoscopy Hep C screening       Carollee Leitz, MD [place]

## 2019-08-15 ENCOUNTER — Encounter: Payer: Self-pay | Admitting: Family Medicine

## 2019-08-15 ENCOUNTER — Ambulatory Visit (INDEPENDENT_AMBULATORY_CARE_PROVIDER_SITE_OTHER): Payer: No Typology Code available for payment source | Admitting: Family Medicine

## 2019-08-15 ENCOUNTER — Other Ambulatory Visit: Payer: Self-pay

## 2019-08-15 VITALS — BP 100/80 | HR 89 | Ht 61.5 in | Wt 209.2 lb

## 2019-08-15 DIAGNOSIS — Z6838 Body mass index (BMI) 38.0-38.9, adult: Secondary | ICD-10-CM

## 2019-08-15 DIAGNOSIS — J4540 Moderate persistent asthma, uncomplicated: Secondary | ICD-10-CM

## 2019-08-15 DIAGNOSIS — E669 Obesity, unspecified: Secondary | ICD-10-CM | POA: Diagnosis not present

## 2019-08-15 DIAGNOSIS — Z0000 Encounter for general adult medical examination without abnormal findings: Secondary | ICD-10-CM | POA: Diagnosis not present

## 2019-08-15 DIAGNOSIS — L669 Cicatricial alopecia, unspecified: Secondary | ICD-10-CM

## 2019-08-15 LAB — POCT GLYCOSYLATED HEMOGLOBIN (HGB A1C): Hemoglobin A1C: 6.3 % — AB (ref 4.0–5.6)

## 2019-08-15 MED ORDER — MONTELUKAST SODIUM 10 MG PO TABS: ORAL_TABLET | ORAL | 0 refills | Status: DC

## 2019-08-15 MED ORDER — LEVOCETIRIZINE DIHYDROCHLORIDE 5 MG PO TABS: ORAL_TABLET | ORAL | 0 refills | Status: DC

## 2019-08-16 LAB — BASIC METABOLIC PANEL
BUN/Creatinine Ratio: 18 (ref 9–23)
BUN: 14 mg/dL (ref 6–24)
CO2: 18 mmol/L — ABNORMAL LOW (ref 20–29)
Calcium: 9.8 mg/dL (ref 8.7–10.2)
Chloride: 105 mmol/L (ref 96–106)
Creatinine, Ser: 0.79 mg/dL (ref 0.57–1.00)
GFR calc Af Amer: 102 mL/min/{1.73_m2} (ref >59)
GFR calc non Af Amer: 88 mL/min/{1.73_m2} (ref >59)
Glucose: 107 mg/dL — ABNORMAL HIGH (ref 65–99)
Potassium: 4.7 mmol/L (ref 3.5–5.2)
Sodium: 140 mmol/L (ref 134–144)

## 2019-08-16 LAB — LIPID PANEL
Chol/HDL Ratio: 5.2 ratio — ABNORMAL HIGH (ref 0.0–4.4)
Cholesterol, Total: 193 mg/dL (ref 100–199)
HDL: 37 mg/dL — ABNORMAL LOW (ref >39)
LDL Chol Calc (NIH): 125 mg/dL — ABNORMAL HIGH (ref 0–99)
Triglycerides: 172 mg/dL — ABNORMAL HIGH (ref 0–149)
VLDL Cholesterol Cal: 31 mg/dL (ref 5–40)

## 2019-08-16 LAB — HCV AB W REFLEX TO QUANT PCR: HCV Ab: <0.1 s/co ratio (ref 0.0–0.9)

## 2019-08-16 LAB — HCV INTERPRETATION

## 2019-08-19 ENCOUNTER — Encounter: Payer: Self-pay | Admitting: Family Medicine

## 2019-08-19 NOTE — Assessment & Plan Note (Signed)
Stable and currently not symptomatic. -Refill Singulair and Xyzol

## 2019-08-19 NOTE — Assessment & Plan Note (Addendum)
Discussed lifestyle changes. -Refer to medical nutrition -Information provided for Weight loss clinic -HbA1C, BMP and Lipid panel today -follow up as needed

## 2019-08-19 NOTE — Assessment & Plan Note (Signed)
Patient has two appointments with dermatology scheduled. -Will follow up as patient was not made aware of appointment -Follow up as needed

## 2019-08-19 NOTE — Assessment & Plan Note (Signed)
PAP due 2022 Refer GI for colonoscopy Hep C screening

## 2019-08-22 ENCOUNTER — Encounter: Payer: Self-pay | Admitting: Family Medicine

## 2019-09-04 ENCOUNTER — Ambulatory Visit (INDEPENDENT_AMBULATORY_CARE_PROVIDER_SITE_OTHER): Payer: No Typology Code available for payment source | Admitting: Family Medicine

## 2019-09-04 ENCOUNTER — Encounter (INDEPENDENT_AMBULATORY_CARE_PROVIDER_SITE_OTHER): Payer: Self-pay | Admitting: Family Medicine

## 2019-09-04 ENCOUNTER — Other Ambulatory Visit: Payer: Self-pay

## 2019-09-04 VITALS — BP 130/81 | HR 76 | Temp 98.3°F | Ht 62.0 in | Wt 208.0 lb

## 2019-09-04 DIAGNOSIS — Z6838 Body mass index (BMI) 38.0-38.9, adult: Secondary | ICD-10-CM

## 2019-09-04 DIAGNOSIS — E7849 Other hyperlipidemia: Secondary | ICD-10-CM

## 2019-09-04 DIAGNOSIS — Z9189 Other specified personal risk factors, not elsewhere classified: Secondary | ICD-10-CM

## 2019-09-04 DIAGNOSIS — K219 Gastro-esophageal reflux disease without esophagitis: Secondary | ICD-10-CM | POA: Diagnosis not present

## 2019-09-04 DIAGNOSIS — K5909 Other constipation: Secondary | ICD-10-CM

## 2019-09-04 DIAGNOSIS — R5383 Other fatigue: Secondary | ICD-10-CM

## 2019-09-04 DIAGNOSIS — Z1331 Encounter for screening for depression: Secondary | ICD-10-CM | POA: Diagnosis not present

## 2019-09-04 DIAGNOSIS — R7303 Prediabetes: Secondary | ICD-10-CM

## 2019-09-04 DIAGNOSIS — R0602 Shortness of breath: Secondary | ICD-10-CM | POA: Diagnosis not present

## 2019-09-04 DIAGNOSIS — Z0289 Encounter for other administrative examinations: Secondary | ICD-10-CM

## 2019-09-04 NOTE — Progress Notes (Signed)
Dear Dr. Owens Shark,   Thank you for referring Rebecca Olson to our clinic. The following note includes my evaluation and treatment recommendations.  Chief Complaint:   OBESITY Rebecca Olson (MR# [id number]) is a 49 y.o. female who presents for evaluation and treatment of obesity and related comorbidities. Current BMI is Body mass index is 38.04 kg/m. [name] has been struggling with her weight for many years and has been unsuccessful in either losing weight, maintaining weight loss, or reaching her healthy weight goal.  [name] is currently in the action stage of change and ready to dedicate time achieving and maintaining a healthier weight. [name] is interested in becoming our patient and working on intensive lifestyle modifications including (but not limited to) diet and exercise for weight loss.  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  She lives alone.  She has the desire to lose 80+ pounds in 8 months.  She eats out 3+ days per week.  She craves ice cream, Dorito's, guacamole, and Cheez-Its.  She has used phentermine and Contrave in the past and believes Contrave worked the best.  Occasionally skips breakfast.  She has 2 children, ages 84 and 17.  Her mother has diabetes and hypertension, etc.  Father is healthy and runs 8 miles per day.    [name]'s habits were reviewed today and are as follows: her desired weight loss is 80 pounds, she started gaining weight after having her first child, her heaviest weight ever was 235 pounds, she craves ice cream, Dorito's, and guacamole, she skips breakfast frequently, she frequently makes poor food choices, she has problems with excessive hunger, she frequently eats larger portions than normal and she struggles with emotional eating.  Depression Screen Daci's Food and Mood (modified PHQ-9) score was 8.  Depression screen PHQ 2/9 09/04/2019  Decreased Interest 1  Down, Depressed, Hopeless 1  PHQ - 2 Score 2  Altered sleeping 2  Tired, decreased  energy 3  Change in appetite 0  Feeling bad or failure about yourself  0  Trouble concentrating 1  Moving slowly or fidgety/restless 0  Suicidal thoughts 0  PHQ-9 Score 8  Difficult doing work/chores Not difficult at all   Subjective:   1. Other fatigue [name] reports daytime somnolence and admits to waking up still tired. Patent has a history of symptoms of daytime fatigue, morning fatigue, morning headache and snoring. [name] generally gets 6 or 7 hours of sleep per night, and states that she has poor quality sleep. Snoring is present. Apneic episodes are present. Epworth Sleepiness Score is 10.  2. SOB (shortness of breath) on exertion Quintasia notes increasing shortness of breath with exercising and seems to be worsening over time with weight gain. She notes getting out of breath sooner with activity than she used to. This has gotten worse recently. Iram denies shortness of breath at rest or orthopnea.  3. Gastroesophageal reflux disease, unspecified whether esophagitis present Arvis is experiencing GERD symptoms.  She is not on any medication to treat it.  4. Prediabetes [name] has a diagnosis of prediabetes based on her elevated HgA1c and was informed this puts her at greater risk of developing diabetes. She continues to work on diet and exercise to decrease her risk of diabetes. She denies nausea or hypoglycemia.  She recently had labs.  Her previous A1c from 08/15/2019 was 6.3, but she was never told she was prediabetic.  Lab Results  Component Value Date   HGBA1C 6.3 (A) 08/15/2019  5. Other hyperlipidemia with hypertriglyceridemia [name] has hyperlipidemia and has been trying to improve her cholesterol levels with intensive lifestyle modification including a low saturated fat diet, exercise and weight loss. She denies any chest pain, claudication or myalgias.  Lab Results  Component Value Date   ALT 16 09/24/2016   AST 20 09/24/2016   ALKPHOS 106 09/24/2016   BILITOT 0.4  09/24/2016   Lab Results  Component Value Date   CHOL 193 08/15/2019   HDL 37 (L) 08/15/2019   LDLCALC 125 (H) 08/15/2019   TRIG 172 (H) 08/15/2019   CHOLHDL 5.2 (H) 08/15/2019   6. Chronic constipation [name] notes constipation.    7. Depression screening [name] was screened for depression as part of her new patient workup.  PHQ-9 is 8.  8. At risk for diabetes mellitus [name] is at higher than average risk for developing diabetes due to her obesity and newly diagnosed prediabetes, which we told her about today (diagnosed her with today).   Assessment/Plan:   1. Other fatigue [name] does feel that her weight is causing her energy to be lower than it should be. Fatigue may be related to obesity, depression or many other causes. Labs will be ordered, and in the meanwhile, [name] will focus on self care including making healthy food choices, increasing physical activity and focusing on stress reduction. - EKG 12-Lead - Hepatic function panel - CBC with Differential/Platelet - VITAMIN D 25 Hydroxy (Vit-D Deficiency, Fractures) - Vitamin B12 - Folate - T3 - T4 - TSH  2. SOB (shortness of breath) on exertion [name] does feel that she gets out of breath more easily that she used to when she exercises. [name]'s shortness of breath appears to be obesity related and exercise induced. She has agreed to work on weight loss and gradually increase exercise to treat her exercise induced shortness of breath. Will continue to monitor closely.  3. Gastroesophageal reflux disease, unspecified whether esophagitis present Intensive lifestyle modifications are the first line treatment for this issue. We discussed several lifestyle modifications today and she will continue to work on diet, exercise and weight loss efforts. Orders and follow up as documented in patient record.   Counseling . If a person has gastroesophageal reflux disease (GERD), food and stomach acid move back up into the esophagus and  cause symptoms or problems such as damage to the esophagus. . Anti-reflux measures include: raising the head of the bed, avoiding tight clothing or belts, avoiding eating late at night, not lying down shortly after mealtime, and achieving weight loss. . Avoid ASA, NSAID's, caffeine, alcohol, and tobacco.  . OTC Pepcid and/or Tums are often very helpful for as needed use.  Marland Kitchen However, for persisting chronic or daily symptoms, stronger medications like Omeprazole may be needed. . You may need to avoid foods and drinks such as: ? Coffee and tea (with or without caffeine). ? Drinks that contain alcohol. ? Energy drinks and sports drinks. ? Bubbly (carbonated) drinks or sodas. ? Chocolate and cocoa. ? Peppermint and mint flavorings. ? Garlic and onions. ? Horseradish. ? Spicy and acidic foods. These include peppers, chili powder, curry powder, vinegar, hot sauces, and BBQ sauce. ? Citrus fruit juices and citrus fruits, such as oranges, lemons, and limes. ? Tomato-based foods. These include red sauce, chili, salsa, and pizza with red sauce. ? Fried and fatty foods. These include donuts, french fries, potato chips, and high-fat dressings. ? High-fat meats. These include hot dogs, rib eye steak, sausage, ham, and bacon.  4. Prediabetes New.  Discussed labs with patient today.  Kerby will continue to work on weight loss, exercise, and decreasing simple carbohydrates to help decrease the risk of diabetes.  - Insulin, random  5. Other hyperlipidemia with hypertriglyceridemia New.  Discussed labs with patient today.  Cardiovascular risk and specific lipid/LDL goals reviewed.  We discussed several lifestyle modifications today and Shawntrice will continue to work on diet, exercise and weight loss efforts. Orders and follow up as documented in patient record.   Counseling Intensive lifestyle modifications are the first line treatment for this issue. . Dietary changes: Increase soluble fiber. Decrease simple  carbohydrates. . Exercise changes: Moderate to vigorous-intensity aerobic activity 150 minutes per week if tolerated. Lipid-lowering medications: see documented in medical record.  6. Chronic constipation Babara was informed that a decrease in bowel movement frequency is normal while losing weight, but stools should not be hard or painful. Orders and follow up as documented in patient record.   Counseling Getting to Good Bowel Health: Your goal is to have one soft bowel movement each day. Drink at least 8 glasses of water each day. Eat plenty of fiber (goal is over 25 grams each day). It is best to get most of your fiber from dietary sources which includes leafy green vegetables, fresh fruit, and whole grains. You may need to add fiber with the help of OTC fiber supplements. These include Metamucil, Citrucel, and Flaxseed. If you are still having trouble, try adding Miralax or Magnesium Citrate. If all of these changes do not work, Cabin crew.  7. Depression screening Ayerim had a positive depression screening. Depression is commonly associated with obesity and often results in emotional eating behaviors. We will monitor this closely and work on CBT to help improve the non-hunger eating patterns. Referral to Psychology may be required if no improvement is seen as she continues in our clinic.  8. At risk for diabetes mellitus Elizet was given approximately 15 minutes of diabetes education and counseling today. We discussed intensive lifestyle modifications today with an emphasis on weight loss as well as increasing exercise and decreasing simple carbohydrates in her diet. We also reviewed medication options with an emphasis on risk versus benefit of those discussed.   Repetitive spaced learning was employed today to elicit superior memory formation and behavioral change.  9. Class 2 severe obesity with serious comorbidity and body mass index (BMI) of 38.0 to 38.9 in adult, unspecified  obesity type [facility]) Gearlene is currently in the action stage of change and her goal is to continue with weight loss efforts. I recommend Marketa begin the structured treatment plan as follows:  She has agreed to the Category 2 Plan +100 calories of 10:1 ratio item.  Exercise goals: As is.   Behavioral modification strategies: increasing lean protein intake, increasing water intake, decreasing eating out, no skipping meals, meal planning and cooking strategies and planning for success.  She was informed of the importance of frequent follow-up visits to maximize her success with intensive lifestyle modifications for her multiple health conditions. She was informed we would discuss her lab results at her next visit unless there is a critical issue that needs to be addressed sooner. Azarya agreed to keep her next visit at the agreed upon time to discuss these results.  Objective:   Blood pressure 130/81, pulse 76, temperature 98.3 F (36.8 C), height 5\' 2"  (1.575 m), weight 208 lb (94.3 kg), SpO2 99 %. Body mass index is 38.04 kg/m.  EKG: Normal  sinus rhythm, rate 85 bpm.  Indirect Calorimeter completed today shows a VO2 of 292 and a REE of 2032.  Her calculated basal metabolic rate is 3748 thus her basal metabolic rate is better than expected.  General: Cooperative, alert, well developed, in no acute distress. HEENT: Conjunctivae and lids unremarkable. Cardiovascular: Regular rhythm.  Lungs: Normal work of breathing. Neurologic: No focal deficits.   Lab Results  Component Value Date   CREATININE 0.79 08/15/2019   BUN 14 08/15/2019   NA 140 08/15/2019   K 4.7 08/15/2019   CL 105 08/15/2019   CO2 18 (L) 08/15/2019   Lab Results  Component Value Date   ALT 16 09/24/2016   AST 20 09/24/2016   ALKPHOS 106 09/24/2016   BILITOT 0.4 09/24/2016   Lab Results  Component Value Date   HGBA1C 6.3 (A) 08/15/2019   Lab Results  Component Value Date   TSH 1.300 03/22/2012   Lab Results    Component Value Date   CHOL 193 08/15/2019   HDL 37 (L) 08/15/2019   LDLCALC 125 (H) 08/15/2019   TRIG 172 (H) 08/15/2019   CHOLHDL 5.2 (H) 08/15/2019   Lab Results  Component Value Date   WBC 11.6 (H) 01/03/2019   HGB 12.1 01/03/2019   HCT 35.9 01/03/2019   MCV 71 (L) 01/03/2019   PLT 379 01/03/2019   Attestation Statements:   Reviewed by clinician on day of visit: allergies, medications, problem list, medical history, surgical history, family history, social history, and previous encounter notes. I, Water quality scientist, CMA, am acting as transcriptionist for [employer], DO I have reviewed the above documentation for accuracy and completeness, and I agree with the above. Mellody Dance, DO

## 2019-09-05 LAB — CBC WITH DIFFERENTIAL/PLATELET
Basophils Absolute: 0.1 10*3/uL (ref 0.0–0.2)
Basos: 1 %
EOS (ABSOLUTE): 0.1 10*3/uL (ref 0.0–0.4)
Eos: 1 %
Hematocrit: 39.6 % (ref 34.0–46.6)
Hemoglobin: 12.8 g/dL (ref 11.1–15.9)
Immature Grans (Abs): 0 10*3/uL (ref 0.0–0.1)
Immature Granulocytes: 0 %
Lymphocytes Absolute: 3.6 10*3/uL — ABNORMAL HIGH (ref 0.7–3.1)
Lymphs: 41 %
MCH: 23.3 pg — ABNORMAL LOW (ref 26.6–33.0)
MCHC: 32.3 g/dL (ref 31.5–35.7)
MCV: 72 fL — ABNORMAL LOW (ref 79–97)
Monocytes Absolute: 0.4 10*3/uL (ref 0.1–0.9)
Monocytes: 4 %
Neutrophils Absolute: 4.7 10*3/uL (ref 1.4–7.0)
Neutrophils: 53 %
Platelets: 322 10*3/uL (ref 150–450)
RBC: 5.49 x10E6/uL — ABNORMAL HIGH (ref 3.77–5.28)
RDW: 18.1 % — ABNORMAL HIGH (ref 11.7–15.4)
WBC: 8.8 10*3/uL (ref 3.4–10.8)

## 2019-09-05 LAB — HEPATIC FUNCTION PANEL
ALT: 15 IU/L (ref 0–32)
AST: 15 IU/L (ref 0–40)
Albumin: 4.3 g/dL (ref 3.8–4.8)
Alkaline Phosphatase: 133 IU/L — ABNORMAL HIGH (ref 48–121)
Bilirubin Total: 0.3 mg/dL (ref 0.0–1.2)
Bilirubin, Direct: 0.08 mg/dL (ref 0.00–0.40)
Total Protein: 7.2 g/dL (ref 6.0–8.5)

## 2019-09-05 LAB — T3: T3, Total: 126 ng/dL (ref 71–180)

## 2019-09-05 LAB — VITAMIN D 25 HYDROXY (VIT D DEFICIENCY, FRACTURES): Vit D, 25-Hydroxy: 23.6 ng/mL — ABNORMAL LOW (ref 30.0–100.0)

## 2019-09-05 LAB — INSULIN, RANDOM: INSULIN: 71 u[IU]/mL — ABNORMAL HIGH (ref 2.6–24.9)

## 2019-09-05 LAB — FOLATE: Folate: 7.1 ng/mL (ref >3.0)

## 2019-09-05 LAB — VITAMIN B12: Vitamin B-12: 615 pg/mL (ref 232–1245)

## 2019-09-05 LAB — TSH: TSH: 1.67 u[IU]/mL (ref 0.450–4.500)

## 2019-09-05 LAB — T4: T4, Total: 6.4 ug/dL (ref 4.5–12.0)

## 2019-09-18 ENCOUNTER — Other Ambulatory Visit: Payer: Self-pay

## 2019-09-18 ENCOUNTER — Ambulatory Visit (INDEPENDENT_AMBULATORY_CARE_PROVIDER_SITE_OTHER): Payer: No Typology Code available for payment source | Admitting: Family Medicine

## 2019-09-18 ENCOUNTER — Encounter (INDEPENDENT_AMBULATORY_CARE_PROVIDER_SITE_OTHER): Payer: Self-pay | Admitting: Family Medicine

## 2019-09-18 VITALS — BP 121/77 | HR 69 | Temp 98.3°F | Ht 62.0 in | Wt 203.0 lb

## 2019-09-18 DIAGNOSIS — R7303 Prediabetes: Secondary | ICD-10-CM

## 2019-09-18 DIAGNOSIS — E7849 Other hyperlipidemia: Secondary | ICD-10-CM

## 2019-09-18 DIAGNOSIS — Z9189 Other specified personal risk factors, not elsewhere classified: Secondary | ICD-10-CM

## 2019-09-18 DIAGNOSIS — E559 Vitamin D deficiency, unspecified: Secondary | ICD-10-CM | POA: Diagnosis not present

## 2019-09-18 DIAGNOSIS — D509 Iron deficiency anemia, unspecified: Secondary | ICD-10-CM

## 2019-09-18 DIAGNOSIS — K5909 Other constipation: Secondary | ICD-10-CM

## 2019-09-18 DIAGNOSIS — Z6837 Body mass index (BMI) 37.0-37.9, adult: Secondary | ICD-10-CM

## 2019-09-18 MED ORDER — FERROUS SULFATE 325 (65 FE) MG PO TBEC: 325.0000 mg | DELAYED_RELEASE_TABLET | Freq: Every day | ORAL | 0 refills | Status: DC

## 2019-09-18 MED ORDER — VITAMIN D (ERGOCALCIFEROL) 1.25 MG (50000 UNIT) PO CAPS: ORAL_CAPSULE | ORAL | 0 refills | Status: DC

## 2019-09-18 NOTE — Patient Instructions (Signed)
The 10-year ASCVD risk score [name] DC Brooke Bonito., et al., 2013) is: 2.4%   Values used to calculate the score:     Age: 49 years     Sex: Female     Is Non-Hispanic African American: Yes     Diabetic: No     Tobacco smoker: No     Systolic Blood Pressure: 684 mmHg     Is BP treated: No     HDL Cholesterol: 37 mg/dL     Total Cholesterol: 193 mg/dL

## 2019-09-19 NOTE — Progress Notes (Signed)
Chief Complaint:   OBESITY Rebecca Olson is here to discuss her progress with her obesity treatment plan along with follow-up of her obesity related diagnoses. Rebecca Olson is on the Category 2 Plan +100 calories of a 10:1 ration item and states she is following her eating plan approximately 90% of the time. Rebecca Olson states she is using the elliptical machine for 45-50 minutes 2 times per week.  Today's visit was #: 2 Starting weight: 208 lbs Starting date: 09/04/2019 Today's weight: 203 lbs Today's date: 09/18/2019 Total lbs lost to date: 5 lbs Total lbs lost since last in-office visit: 5 lbs  Interim History: Rebecca Olson says she was not that hungry and it was difficult to get all the food in.  Her cravings were well-controlled.  She is taking food with her to work.  She says things are going well and she feels the meal plan is good for her/"doable".    Subjective:   1. Prediabetes Rebecca Olson has a diagnosis of prediabetes based on her elevated HgA1c and was informed this puts her at greater risk of developing diabetes. She continues to work on diet and exercise to decrease her risk of diabetes. She denies nausea or hypoglycemia.  Lab Results  Component Value Date   HGBA1C 6.3 (A) 08/15/2019   Lab Results  Component Value Date   INSULIN 71.0 (H) 09/04/2019   2. Other hyperlipidemia with increased triglycerides Rebecca Olson has hyperlipidemia and has been trying to improve her cholesterol levels with intensive lifestyle modification including a low saturated fat diet, exercise and weight loss. She denies any chest pain, claudication or myalgias.  Lab Results  Component Value Date   ALT 15 09/04/2019   AST 15 09/04/2019   ALKPHOS 133 (H) 09/04/2019   BILITOT 0.3 09/04/2019   Lab Results  Component Value Date   CHOL 193 08/15/2019   HDL 37 (L) 08/15/2019   LDLCALC 125 (H) 08/15/2019   TRIG 172 (H) 08/15/2019   CHOLHDL 5.2 (H) 08/15/2019   3. Vitamin D deficiency Rebecca Olson Vitamin D level was 23.6 on  09/04/2019. She is currently taking no vitamin D supplement. She denies nausea, vomiting or muscle weakness.  4. Microcytic anemia Rebecca Olson is not a vegetarian.  She does not have a history of weight loss surgery.   CBC Latest Ref Rng & Units 09/04/2019 01/03/2019 07/03/2013  WBC 3.4 - 10.8 x10E3/uL 8.8 11.6(H) 10.4  Hemoglobin 11.1 - 15.9 g/dL 12.8 12.1 12.4  Hematocrit 34.0 - 46.6 % 39.6 35.9 35.8(L)  Platelets 150 - 450 x10E3/uL 322 379 302   Lab Results  Component Value Date   VITAMINB12 615 09/04/2019   5. Chronic constipation Rebecca Olson notes constipation.   6. At risk for diabetes mellitus Rebecca Olson is at higher than average risk for developing diabetes due to her obesity.   Assessment/Plan:   1. Prediabetes New onset.  Discussed labs with patient today.  Rebecca Olson will continue to work on weight loss, exercise, and decreasing simple carbohydrates to help decrease the risk of diabetes.   - Recheck A1c and fasting insulin around 11/15/2019.  2. Other hyperlipidemia with increased triglycerides New onset.  Discussed applicable labs with patient today.  Cardiovascular risk and specific lipid/LDL goals reviewed.  We discussed several lifestyle modifications today and Rebecca Olson will continue to work on diet, exercise and weight loss efforts. Orders and follow up as documented in patient record.     Recent Results (from the past 2160 hour(s))  HgB A1c     Status: Abnormal  Collection Time: 08/15/19  8:53 AM  Result Value Ref Range   Hemoglobin A1C 6.3 (A) 4.0 - 5.6 %   HbA1c POC (<> result, manual entry)     HbA1c, POC (prediabetic range)     HbA1c, POC (controlled diabetic range)    HCV Ab w Reflex to Quant PCR     Status: None   Collection Time: 08/15/19 11:05 AM  Result Value Ref Range   HCV Ab <0.1 0.0 - 0.9 s/co ratio  Lipid Panel     Status: Abnormal   Collection Time: 08/15/19 11:05 AM  Result Value Ref Range   Cholesterol, Total 193 100 - 199 mg/dL   Triglycerides 172 (H) 0 - 149  mg/dL   HDL 37 (L) >39 mg/dL   VLDL Cholesterol Cal 31 5 - 40 mg/dL   LDL Chol Calc (NIH) 125 (H) 0 - 99 mg/dL   Chol/HDL Ratio 5.2 (H) 0.0 - 4.4 ratio    Comment:                                   T. Chol/HDL Ratio                                             Men  Women                               1/2 Avg.Risk  3.4    3.3                                   Avg.Risk  5.0    4.4                                2X Avg.Risk  9.6    7.1                                3X Avg.Risk 23.4   85.0   Basic Metabolic Panel     Status: Abnormal   Collection Time: 08/15/19 11:05 AM  Result Value Ref Range   Glucose 107 (H) 65 - 99 mg/dL   BUN 14 6 - 24 mg/dL   Creatinine, Ser 0.79 0.57 - 1.00 mg/dL   GFR calc non Af Amer 88 >59 mL/min/1.73   GFR calc Af Amer 102 >59 mL/min/1.73    Comment: **[facility] currently reports eGFR in compliance with the current**   recommendations of the [employer]. [facility] will   update reporting as new guidelines are published from the NKF-ASN   Task force.    BUN/Creatinine Ratio 18 9 - 23   Sodium 140 134 - 144 mmol/L   Potassium 4.7 3.5 - 5.2 mmol/L   Chloride 105 96 - 106 mmol/L   CO2 18 (L) 20 - 29 mmol/L   Calcium 9.8 8.7 - 10.2 mg/dL  Interpretation:     Status: None   Collection Time: 08/15/19 11:05 AM  Result Value Ref Range   HCV Interp 1: Comment     Comment: Negative Not infected with HCV, unless  recent infection is suspected or other evidence exists to indicate HCV infection.   Hepatic function panel     Status: Abnormal   Collection Time: 09/04/19  2:46 PM  Result Value Ref Range   Total Protein 7.2 6.0 - 8.5 g/dL   Albumin 4.3 3.8 - 4.8 g/dL   Bilirubin Total 0.3 0.0 - 1.2 mg/dL   Bilirubin, Direct 0.08 0.00 - 0.40 mg/dL   Alkaline Phosphatase 133 (H) 48 - 121 IU/L   AST 15 0 - 40 IU/L   ALT 15 0 - 32 IU/L  CBC with Differential/Platelet     Status: Abnormal   Collection Time: 09/04/19  2:46 PM  Result Value Ref Range   WBC  8.8 3.4 - 10.8 x10E3/uL   RBC 5.49 (H) 3.77 - 5.28 x10E6/uL   Hemoglobin 12.8 11.1 - 15.9 g/dL   Hematocrit 39.6 34.0 - 46.6 %   MCV 72 (L) 79 - 97 fL   MCH 23.3 (L) 26.6 - 33.0 pg   MCHC 32.3 31 - 35 g/dL   RDW 18.1 (H) 11.7 - 15.4 %   Platelets 322 150 - 450 x10E3/uL   Neutrophils 53 Not Estab. %   Lymphs 41 Not Estab. %   Monocytes 4 Not Estab. %   Eos 1 Not Estab. %   Basos 1 Not Estab. %   Neutrophils Absolute 4.7 1 - 7 x10E3/uL   Lymphocytes Absolute 3.6 (H) 0 - 3 x10E3/uL   Monocytes Absolute 0.4 0 - 0 x10E3/uL   EOS (ABSOLUTE) 0.1 0.0 - 0.4 x10E3/uL   Basophils Absolute 0.1 0 - 0 x10E3/uL   Immature Granulocytes 0 Not Estab. %   Immature Grans (Abs) 0.0 0.0 - 0.1 x10E3/uL  VITAMIN D 25 Hydroxy (Vit-D Deficiency, Fractures)     Status: Abnormal   Collection Time: 09/04/19  2:46 PM  Result Value Ref Range   Vit D, 25-Hydroxy 23.6 (L) 30.0 - 100.0 ng/mL    Comment: Vitamin D deficiency has been defined by the Institute of Medicine and an Endocrine Society practice guideline as a level of serum 25-OH vitamin D less than 20 ng/mL (1,2). The Endocrine Society went on to further define vitamin D insufficiency as a level between 21 and 29 ng/mL (2). 1. IOM (Institute of Medicine). 2010. Dietary reference    intakes for calcium and D. [place]: The    [employer]. 2. Holick MF, Binkley [place], Bischoff-Ferrari HA, et al.    Evaluation, treatment, and prevention of vitamin D    deficiency: an Endocrine Society clinical practice    guideline. JCEM. 2011 Jul; 96(7):1911-30.   Vitamin B12     Status: None   Collection Time: 09/04/19  2:46 PM  Result Value Ref Range   Vitamin B-12 615 232 - 1,245 pg/mL  Folate     Status: None   Collection Time: 09/04/19  2:46 PM  Result Value Ref Range   Folate 7.1 >3.0 ng/mL    Comment: A serum folate concentration of less than 3.1 ng/mL is considered to represent clinical deficiency.   T3     Status: None   Collection  Time: 09/04/19  2:46 PM  Result Value Ref Range   T3, Total 126 71 - 180 ng/dL  T4     Status: None   Collection Time: 09/04/19  2:46 PM  Result Value Ref Range   T4, Total 6.4 4.5 - 12.0 ug/dL  TSH     Status: None   Collection  Time: 09/04/19  2:46 PM  Result Value Ref Range   TSH 1.670 0.450 - 4.500 uIU/mL  Insulin, random     Status: Abnormal   Collection Time: 09/04/19  2:46 PM  Result Value Ref Range   INSULIN 71.0 (H) 2.6 - 24.9 uIU/mL   Counseling Intensive lifestyle modifications are the first line treatment for this issue. . Dietary changes: Increase soluble fiber. Decrease simple carbohydrates. . Exercise changes: Moderate to vigorous-intensity aerobic activity 150 minutes per week if tolerated. . Lipid-lowering medications: see documented in medical record.  3. Vitamin D deficiency New onset.  Discussed labs with patient today.  Low Vitamin D level contributes to fatigue and are associated with obesity, breast, and colon cancer. -  She agrees to start to take prescription Vitamin D _0 ,000 IU every week and will follow-up for routine testing of Vitamin D, at least 2-3 times per year to avoid over-replacement.  -  Recheck levels in 3 months or so.  -Start Vitamin D, Ergocalciferol, (DRISDOL) 1.25 MG (50000 UNIT) CAPS capsule; Take one tablet wkly  Dispense: 4 capsule; Refill: 0  4. Microcytic anemia Worsening.  Discussed labs with patient today.  Rebecca Olson will take her multivitamin daily and start an iron supplement, as per below.  Orders and follow up as documented in patient record.  Counseling . Iron is essential for our bodies to make red blood cells.  Reasons that someone may be deficient include: an iron-deficient diet (more likely in those following vegan or vegetarian diets), women with heavy menses, patients with GI disorders or poor absorption, patients that have had bariatric surgery, frequent blood donors, patients with cancer, and patients with heart disease.    [place] foods include dark leafy greens, red and white meats, eggs, seafood, and beans.   . Certain foods and drinks prevent your body from absorbing iron properly. Avoid eating these foods in the same meal as iron-rich foods or with iron supplements. These foods include: coffee, black tea, and red wine; milk, dairy products, and foods that are high in calcium; beans and soybeans; whole grains.  . Constipation can be a side effect of iron supplementation. Increased water and fiber intake are helpful. Water goal: > 2 liters/day. Fiber goal: > 25 grams/day.  -Start ferrous sulfate 325 (65 FE) MG EC tablet; Take 1 tablet (325 mg total) by mouth daily with breakfast.  Dispense: 30 tablet; Refill: 0   5. Chronic constipation Discussed labs with patient today.  Rebecca Olson was informed that a decrease in bowel movement frequency is normal while losing weight, but stools should not be hard or painful. Orders and follow up as documented in patient record.  MiraLAX as needed and increase water intake.  Counseling Getting to Good Bowel Health: Your goal is to have one soft bowel movement each day. Drink 1/2 wt in lbs, in oz water per day. Eat plenty of fiber (goal is over 25 grams each day). It is best to get most of your fiber from dietary sources which includes leafy green vegetables, fresh fruit, and whole grains. You may need to add fiber with the help of OTC fiber supplements. These include Metamucil, Citrucel, and Psyllium husks. If you are still having trouble, try adding Miralax or Magnesium Citrate. If all of these changes do not work, Cabin [employer].  6. At risk for diabetes mellitus Rebecca Olson was given approximately 23 minutes of diabetes education and general counseling regarding this condition today. We discussed intensive lifestyle modifications today with an emphasis  on weight loss as well as increasing exercise and decreasing simple carbohydrates in her diet. We also reviewed medication  options with an emphasis on risk versus benefit of those discussed.  Repetitive spaced learning was employed today to elicit superior memory formation and behavioral change.  7. Class 2 severe obesity with serious comorbidity and body mass index (BMI) of 37.0 to 37.9 in adult, unspecified obesity type Rebecca Olson) Rebecca Olson is currently in the action stage of change. As such, her goal is to continue with weight loss efforts. She has agreed to the Category 2 Plan +100 calories of a 10:1 ratio item.  She is tolerating the medicine well w/o S-E.   Continue Contrave.  Exercise goals: As is.  Behavioral modification strategies: increasing lean protein intake, decreasing simple carbohydrates, increasing vegetables, increasing water intake, meal planning and cooking strategies and planning for success.  Rebecca Olson has agreed to follow-up with our clinic in 2 weeks. She was informed of the importance of frequent follow-up visits to maximize her success with intensive lifestyle modifications for her multiple health conditions.    Objective:   Blood pressure 121/77, pulse 69, temperature 98.3 F (36.8 C), height 5' 2" (1.575 m), weight 203 lb (92.1 kg), SpO2 98 %. Body mass index is 37.13 kg/m.  General: Cooperative, alert, well developed, in no acute distress. HEENT: Conjunctivae and lids unremarkable. Cardiovascular: Regular rhythm.  Lungs: Normal work of breathing. Neurologic: No focal deficits.   Lab Results  Component Value Date   CREATININE 0.79 08/15/2019   BUN 14 08/15/2019   NA 140 08/15/2019   K 4.7 08/15/2019   CL 105 08/15/2019   CO2 18 (L) 08/15/2019   Lab Results  Component Value Date   ALT 15 09/04/2019   AST 15 09/04/2019   ALKPHOS 133 (H) 09/04/2019   BILITOT 0.3 09/04/2019   Lab Results  Component Value Date   HGBA1C 6.3 (A) 08/15/2019   Lab Results  Component Value Date   INSULIN 71.0 (H) 09/04/2019   Lab Results  Component Value Date   TSH 1.670 09/04/2019   Lab  Results  Component Value Date   CHOL 193 08/15/2019   HDL 37 (L) 08/15/2019   LDLCALC 125 (H) 08/15/2019   TRIG 172 (H) 08/15/2019   CHOLHDL 5.2 (H) 08/15/2019   Lab Results  Component Value Date   WBC 8.8 09/04/2019   HGB 12.8 09/04/2019   HCT 39.6 09/04/2019   MCV 72 (L) 09/04/2019   PLT 322 09/04/2019   Attestation Statements:   Reviewed by clinician on day of visit: allergies, medications, problem list, medical history, surgical history, family history, social history, and previous encounter notes.  I, Water quality scientist, CMA, am acting as Location manager for [employer], DO.  I have reviewed the above documentation for accuracy and completeness, and I agree with the above. Mellody Dance, DO

## 2019-10-03 ENCOUNTER — Ambulatory Visit (INDEPENDENT_AMBULATORY_CARE_PROVIDER_SITE_OTHER): Payer: No Typology Code available for payment source | Admitting: Family Medicine

## 2019-10-03 ENCOUNTER — Encounter (INDEPENDENT_AMBULATORY_CARE_PROVIDER_SITE_OTHER): Payer: Self-pay | Admitting: Family Medicine

## 2019-10-03 ENCOUNTER — Other Ambulatory Visit: Payer: Self-pay

## 2019-10-03 VITALS — BP 114/80 | HR 68 | Temp 98.2°F | Ht 62.0 in | Wt 203.0 lb

## 2019-10-03 DIAGNOSIS — E611 Iron deficiency: Secondary | ICD-10-CM | POA: Diagnosis not present

## 2019-10-03 DIAGNOSIS — Z9189 Other specified personal risk factors, not elsewhere classified: Secondary | ICD-10-CM

## 2019-10-03 DIAGNOSIS — E559 Vitamin D deficiency, unspecified: Secondary | ICD-10-CM | POA: Diagnosis not present

## 2019-10-03 DIAGNOSIS — D509 Iron deficiency anemia, unspecified: Secondary | ICD-10-CM | POA: Insufficient documentation

## 2019-10-03 DIAGNOSIS — Z6837 Body mass index (BMI) 37.0-37.9, adult: Secondary | ICD-10-CM | POA: Diagnosis not present

## 2019-10-03 DIAGNOSIS — E66812 Obesity, class 2: Secondary | ICD-10-CM

## 2019-10-03 MED ORDER — VITAMIN D (ERGOCALCIFEROL) 1.25 MG (50000 UNIT) PO CAPS: ORAL_CAPSULE | ORAL | 0 refills | Status: DC

## 2019-10-03 MED ORDER — FERROUS SULFATE 325 (65 FE) MG PO TBEC: 325.0000 mg | DELAYED_RELEASE_TABLET | Freq: Every day | ORAL | 0 refills | Status: DC

## 2019-10-04 NOTE — Progress Notes (Signed)
Chief Complaint:   OBESITY Rebecca Olson is here to discuss her progress with her obesity treatment plan along with follow-up of her obesity related diagnoses. Rebecca Olson is on the Category 2 Plan and states she is following her eating plan approximately 50% of the time. Rebecca Olson states she is walking for 120 minutes 2 times per week.  Today's visit was #: 3 Starting weight: 208 lbs Starting date: 09/04/2019 Today's weight: 203 lbs Today's date: 10/03/2019 Total lbs lost to date: 5 lbs Total lbs lost since last in-office visit: 0  Interim History: Rebecca Olson says that a lot of her days she is skipping meals or not eating all of her proteins/foods.  Yesterday, she had one meal (cheeseburger and fries at [facility]) plus some almonds earlier in the day (100 calorie pack).  She has been making healthier choices overall and doing portion control.  Subjective:   1. Iron deficiency Rebecca Olson is not a vegetarian.  She does not have a history of weight loss surgery.  She started an iron supplement last office visit.  No side effects and she is taking it daily.  Denies constipation , but she is being preventative by taking MiraLAX.  CBC Latest Ref Rng & Units 09/04/2019 01/03/2019 07/03/2013  WBC 3.4 - 10.8 x10E3/uL 8.8 11.6(H) 10.4  Hemoglobin 11.1 - 15.9 g/dL 12.8 12.1 12.4  Hematocrit 34.0 - 46.6 % 39.6 35.9 35.8(L)  Platelets 150 - 450 x10E3/uL 322 379 302   No results found for: IRON, TIBC, FERRITIN Lab Results  Component Value Date   VITAMINB12 615 09/04/2019   2. Vitamin D deficiency Rebecca Olson's Vitamin D level was 23.6 on 09/04/2019. She is currently taking prescription vitamin D 50,000 IU each week. She denies nausea, vomiting or muscle weakness.  3. At risk for osteoporosis Rebecca Olson is at higher risk of osteopenia and osteoporosis due to Vitamin D deficiency.   Assessment/Plan:   1. Iron deficiency Continue supplement.  Refill sent.  Orders and follow up as documented in patient record.  Counseling . Iron  is essential for our bodies to make red blood cells.  Reasons that someone may be deficient include: an iron-deficient diet (more likely in those following vegan or vegetarian diets), women with heavy menses, patients with GI disorders or poor absorption, patients that have had bariatric surgery, frequent blood donors, patients with cancer, and patients with heart disease.   Rebecca Olson foods include dark leafy greens, red and white meats, eggs, seafood, and beans.   . Certain foods and drinks prevent your body from absorbing iron properly. Avoid eating these foods in the same meal as iron-rich foods or with iron supplements. These foods include: coffee, black tea, and red wine; milk, dairy products, and foods that are high in calcium; beans and soybeans; whole grains.  . Constipation can be a side effect of iron supplementation. Increased water and fiber intake are helpful. Water goal: > 2 liters/day. Fiber goal: > 25 grams/day.  -Refill ferrous sulfate 325 (65 FE) MG EC tablet; Take 1 tablet (325 mg total) by mouth daily with breakfast.  Dispense: 30 tablet; Refill: 0  2. Vitamin D deficiency Low Vitamin D level contributes to fatigue and are associated with obesity, breast, and colon cancer. She agrees to continue to take prescription Vitamin D @50 ,000 IU every week and will follow-up for routine testing of Vitamin D, at least 2-3 times per year to avoid over-replacement. Will recheck labs after 2-3 months of treatment.  -Refill Vitamin D, Ergocalciferol, (DRISDOL) 1.25  MG (50000 UNIT) CAPS capsule; Take one tablet wkly  Dispense: 4 capsule; Refill: 0  3. At risk for osteoporosis Rebecca Olson was given approximately 15 minutes of osteoporosis prevention counseling today. Rebecca Olson is at risk for osteopenia and osteoporosis due to her Vitamin D deficiency. She was encouraged to take her Vitamin D and follow her higher calcium diet and increase strengthening exercise to help strengthen her bones and decrease her  risk of osteopenia and osteoporosis.  Repetitive spaced learning was employed today to elicit superior memory formation and behavioral change.  4. Class 2 severe obesity with serious comorbidity and body mass index (BMI) of 37.0 to 37.9 in adult, unspecified obesity type [facility]) Rebecca Olson is currently in the action stage of change. As such, her goal is to continue with weight loss efforts. She has agreed to the Category 2 Plan.   Exercise goals: As is for now.  Behavioral modification strategies: increasing lean protein intake, decreasing simple carbohydrates, decreasing eating out, no skipping meals, meal planning and cooking strategies and planning for success.  Rebecca Olson has agreed to follow-up with our clinic in 2 weeks. She was informed of the importance of frequent follow-up visits to maximize her success with intensive lifestyle modifications for her multiple health conditions.   Objective:   Blood pressure 114/80, pulse 68, temperature 98.2 F (36.8 C), height 5\' 2"  (1.575 m), weight 203 lb (92.1 kg), SpO2 98 %. Body mass index is 37.13 kg/m.  General: Cooperative, alert, well developed, in no acute distress. HEENT: Conjunctivae and lids unremarkable. Cardiovascular: Regular rhythm.  Lungs: Normal work of breathing. Neurologic: No focal deficits.   Lab Results  Component Value Date   CREATININE 0.79 08/15/2019   BUN 14 08/15/2019   NA 140 08/15/2019   K 4.7 08/15/2019   CL 105 08/15/2019   CO2 18 (L) 08/15/2019   Lab Results  Component Value Date   ALT 15 09/04/2019   AST 15 09/04/2019   ALKPHOS 133 (H) 09/04/2019   BILITOT 0.3 09/04/2019   Lab Results  Component Value Date   HGBA1C 6.3 (A) 08/15/2019   Lab Results  Component Value Date   INSULIN 71.0 (H) 09/04/2019   Lab Results  Component Value Date   TSH 1.670 09/04/2019   Lab Results  Component Value Date   CHOL 193 08/15/2019   HDL 37 (L) 08/15/2019   LDLCALC 125 (H) 08/15/2019   TRIG 172 (H) 08/15/2019     CHOLHDL 5.2 (H) 08/15/2019   Lab Results  Component Value Date   WBC 8.8 09/04/2019   HGB 12.8 09/04/2019   HCT 39.6 09/04/2019   MCV 72 (L) 09/04/2019   PLT 322 09/04/2019   Attestation Statements:   Reviewed by clinician on day of visit: allergies, medications, problem list, medical history, surgical history, family history, social history, and previous encounter notes.  I, Water quality scientist, CMA, am acting as Location manager for [employer], DO.  I have reviewed the above documentation for accuracy and completeness, and I agree with the above. Mellody Dance, DO

## 2019-10-17 ENCOUNTER — Encounter (INDEPENDENT_AMBULATORY_CARE_PROVIDER_SITE_OTHER): Payer: Self-pay | Admitting: Family Medicine

## 2019-10-17 ENCOUNTER — Ambulatory Visit (INDEPENDENT_AMBULATORY_CARE_PROVIDER_SITE_OTHER): Payer: No Typology Code available for payment source | Admitting: Family Medicine

## 2019-10-17 ENCOUNTER — Other Ambulatory Visit: Payer: Self-pay

## 2019-10-17 VITALS — BP 113/82 | HR 84 | Temp 98.8°F | Ht 62.0 in | Wt 201.0 lb

## 2019-10-17 DIAGNOSIS — J3089 Other allergic rhinitis: Secondary | ICD-10-CM | POA: Diagnosis not present

## 2019-10-17 DIAGNOSIS — Z6836 Body mass index (BMI) 36.0-36.9, adult: Secondary | ICD-10-CM

## 2019-10-17 DIAGNOSIS — Z9189 Other specified personal risk factors, not elsewhere classified: Secondary | ICD-10-CM | POA: Diagnosis not present

## 2019-10-17 DIAGNOSIS — E559 Vitamin D deficiency, unspecified: Secondary | ICD-10-CM | POA: Diagnosis not present

## 2019-10-17 DIAGNOSIS — J4540 Moderate persistent asthma, uncomplicated: Secondary | ICD-10-CM | POA: Diagnosis not present

## 2019-10-17 DIAGNOSIS — R7303 Prediabetes: Secondary | ICD-10-CM

## 2019-10-17 DIAGNOSIS — E119 Type 2 diabetes mellitus without complications: Secondary | ICD-10-CM | POA: Insufficient documentation

## 2019-10-17 MED ORDER — MONTELUKAST SODIUM 10 MG PO TABS: ORAL_TABLET | ORAL | 0 refills | Status: DC

## 2019-10-17 MED ORDER — VITAMIN D (ERGOCALCIFEROL) 1.25 MG (50000 UNIT) PO CAPS: ORAL_CAPSULE | ORAL | 0 refills | Status: DC

## 2019-10-17 MED ORDER — LEVOCETIRIZINE DIHYDROCHLORIDE 5 MG PO TABS: ORAL_TABLET | ORAL | 0 refills | Status: DC

## 2019-10-21 NOTE — Progress Notes (Signed)
Chief Complaint:   OBESITY Rebecca Olson is here to discuss her progress with her obesity treatment plan along with follow-up of her obesity related diagnoses. Rebecca Olson is on the Category 2 Plan and states she is following her eating plan approximately 98% of the time. Rebecca Olson states she is walking for 60 minutes 1 times per week.  Today's visit was #: 4 Starting weight: 208 lbs Starting date: 09/04/2019 Today's weight: 201 lbs Today's date: 10/17/2019 Total lbs lost to date: 7 lbs Total lbs lost since last in-office visit: 4 lbs  Interim History: Rebecca Olson says she feels good.  Her stomach does not bother her like usual and she has much less bloating.  She has had no trouble getting in 6-8 ounces of protein at night.  She is eating cauliflower, zucchini, mushrooms, etc.  She occasionally eats butter on toast.  Occasionally uses condiments and is not using snack calories to account for them.  Subjective:   1. Environmental and seasonal allergies Rebecca Olson is out of her allergy medication and had difficulty getting in to see her PCP for a refill, and is requesting a refill from me today.  She has symptoms of rhinorrhea, postnasal drip, and occasional cough when they get bad.  She says they are pretty good now, but they get worse in the fall.  2. Moderate persistent asthma without complication Controlled.  Never uses inhaler for rescue.  Well-controlled with oral medication.  She is on Singulair and needs a refill.  3. Vitamin D deficiency Rebecca Olson Vitamin D level was 23.6 on 09/04/2019. She is currently taking prescription vitamin D 50,000 IU each week. She denies nausea, vomiting or muscle weakness.  Fatigue has been improving some since starting vitamin D.  4. Prediabetes Rebecca Olson has a diagnosis of prediabetes based on her elevated HgA1c and was informed this puts her at greater risk of developing diabetes. She continues to work on diet and exercise to decrease her risk of diabetes. She denies nausea or  hypoglycemia.  She endorses some carb cravings, but pretty well-controlled when she follows the meal plan.  Lab Results  Component Value Date   HGBA1C 6.3 (A) 08/15/2019   Lab Results  Component Value Date   INSULIN 71.0 (H) 09/04/2019   5. At risk for medication nonadherence Rebecca Olson is at a higher than average risk for medication non-adherence.  Assessment/Plan:   1. Environmental and seasonal allergies Refill for Xyzal provided today.  Neti pot as needed, avoid exposure.  -Refill levocetirizine (XYZAL) 5 MG tablet; TAKE 1 TABLET BY MOUTH EVERY DAY IN THE EVENING  Dispense: 30 tablet; Refill: 0  2. Moderate persistent asthma without complication Refill for Singulair provided today.  Use albuterol inhaler as needed for shortness of breath.  She declines the need for a refill on her inhaler.  -Refill montelukast (SINGULAIR) 10 MG tablet; TAKE 1 TABLET BY MOUTH EVERYDAY AT BEDTIME  Dispense: 30 tablet; Refill: 0  3. Vitamin D deficiency Low Vitamin D level contributes to fatigue and are associated with obesity, breast, and colon cancer. She agrees to continue to take prescription Vitamin D @50 ,000 IU every week and will follow-up for routine testing of Vitamin D, at least 2-3 times per year to avoid over-replacement.  Recheck level every 3 months (around late October) or so.  Continue weight loss, prudent nutritional plan, decrease simple carbs, increase protein.  -Refill Vitamin D, Ergocalciferol, (DRISDOL) 1.25 MG (50000 UNIT) CAPS capsule; Take one tablet wkly  Dispense: 4 capsule; Refill: 0  4. Prediabetes Rebecca Olson will continue to work on weight loss, exercise, and decreasing simple carbohydrates to help decrease the risk of diabetes.   5. At risk for medication nonadherence Rebecca Olson was given approximately 15 minutes of counseling today to help her avoid medication non-adherence.  We discussed importance of taking medications at a similar time each day and the use of daily pill  organizers to help improve medication adherence.  6. Class 2 severe obesity with serious comorbidity and body mass index (BMI) of 36.0 to 36.9 in adult, unspecified obesity type Rebecca Olson) Rebecca Olson is currently in the action stage of change. As such, her goal is to continue with weight loss efforts. She has agreed to the Category 2 Plan.   Exercise goals: Try to increase frequency to a couple of times per week. Instead of once [employer] modification strategies: increasing lean protein intake, decreasing simple carbohydrates, increasing water intake, meal planning and cooking strategies, better snacking choices and planning for success.  Rebecca Olson has agreed to follow-up with our clinic in 2 weeks. She was informed of the importance of frequent follow-up visits to maximize her success with intensive lifestyle modifications for her multiple health conditions.   Objective:   Blood pressure 113/82, pulse 84, temperature 98.8 F (37.1 C), height 5\' 2"  (1.575 m), weight 201 lb (91.2 kg), SpO2 99 %. Body mass index is 36.76 kg/m.  General: Cooperative, alert, well developed, in no acute distress. HEENT: Conjunctivae and lids unremarkable. Cardiovascular: Regular rhythm.  Lungs: Normal work of breathing. Neurologic: No focal deficits.   Lab Results  Component Value Date   CREATININE 0.79 08/15/2019   BUN 14 08/15/2019   NA 140 08/15/2019   K 4.7 08/15/2019   CL 105 08/15/2019   CO2 18 (L) 08/15/2019   Lab Results  Component Value Date   ALT 15 09/04/2019   AST 15 09/04/2019   ALKPHOS 133 (H) 09/04/2019   BILITOT 0.3 09/04/2019   Lab Results  Component Value Date   HGBA1C 6.3 (A) 08/15/2019   Lab Results  Component Value Date   INSULIN 71.0 (H) 09/04/2019   Lab Results  Component Value Date   TSH 1.670 09/04/2019   Lab Results  Component Value Date   CHOL 193 08/15/2019   HDL 37 (L) 08/15/2019   LDLCALC 125 (H) 08/15/2019   TRIG 172 (H) 08/15/2019   CHOLHDL 5.2 (H)  08/15/2019   Lab Results  Component Value Date   WBC 8.8 09/04/2019   HGB 12.8 09/04/2019   HCT 39.6 09/04/2019   MCV 72 (L) 09/04/2019   PLT 322 09/04/2019   Attestation Statements:   Reviewed by clinician on day of visit: allergies, medications, problem list, medical history, surgical history, family history, social history, and previous encounter notes.  I, Water quality scientist, CMA, am acting as Location manager for [employer], DO.  I have reviewed the above documentation for accuracy and completeness, and I agree with the above. -  Mellody Dance, DO

## 2019-11-05 ENCOUNTER — Other Ambulatory Visit: Payer: Self-pay

## 2019-11-05 ENCOUNTER — Encounter (INDEPENDENT_AMBULATORY_CARE_PROVIDER_SITE_OTHER): Payer: Self-pay | Admitting: Family Medicine

## 2019-11-05 ENCOUNTER — Ambulatory Visit (INDEPENDENT_AMBULATORY_CARE_PROVIDER_SITE_OTHER): Payer: No Typology Code available for payment source | Admitting: Family Medicine

## 2019-11-05 VITALS — BP 115/80 | HR 82 | Temp 98.3°F | Ht 62.0 in | Wt 203.0 lb

## 2019-11-05 DIAGNOSIS — J302 Other seasonal allergic rhinitis: Secondary | ICD-10-CM | POA: Diagnosis not present

## 2019-11-05 DIAGNOSIS — Z6837 Body mass index (BMI) 37.0-37.9, adult: Secondary | ICD-10-CM

## 2019-11-05 DIAGNOSIS — E559 Vitamin D deficiency, unspecified: Secondary | ICD-10-CM | POA: Diagnosis not present

## 2019-11-06 NOTE — Progress Notes (Signed)
Chief Complaint:   OBESITY Rebecca Olson is here to discuss her progress with her obesity treatment plan along with follow-up of her obesity related diagnoses. Rebecca Olson is on the Category 2 Plan and states she is following her eating plan approximately 40-50% of the time. Rebecca Olson states she is exercising for 0 minutes 0 times per week.  Today's visit was #: 5 Starting weight: 208 lbs Starting date: 09/04/2019 Today's weight: 203 lbs Today's date: 11/05/2019 Total lbs lost to date: 5 lbs Total lbs lost since last in-office visit: +2 lbs  Interim History: Rebecca Olson says she had to until after 3 pm fast last week for religious reasons and could not eat all her foods.  She went to [facility] twice in the past 2 weeks.  She was skipping meals and then was more hungry and made poor choices.  She recognizes that she is eating too many condiments (mayo and butter).  Assessment/Plan:   1. Seasonal allergies At last office visit, she started Singulair and Xyzal.  She is tolerating it well and says they are effective.  Rebecca Olson has seasonal allergies that have been more bothersome with the change of weather/season.       Plan:  - Seasonal and environmental allergies and pathophysiology of disease process discussed with patient.    - Preventative strategies as first line for management discussed.  I encouraged use of KN or N-95 mask use as well as sterile saline rinses such as Milta Deiters Med or AYR sinus rinses to be done twice daily and after any prolonged exposure to the environment or allergen.    It is best to use distilled water or previously boiled water   - If eyes are itchy or irritated feeling when your seasonal allergies get bad, ok to use Naphcon-A over-the-counter eyedrops as needed  - If continues to worsen despite preventative strategies, take over-the-counter meds such as Allegra, Xyzal etc daily during allergy seasons or nasal steroids such as Flonase, Rhinocort and the like  - We can consider  Astelin nasal spray, if symptoms are not well controlled with OTC medications, nasal rinses and preventative strategies.  - Encouraged to return to clinic or call the office today discussed further questions or concerns they may have.  2. Vitamin D deficiency Rebecca Olson has a history of Vitamin D deficiency with resultant generalized fatigue as her primary symptom.  she is taking vitamin D 50,000 IU weekly for this deficiency and tolerating it well without side-effect.   Most recent Vitamin D lab reviewed-  level: 23.6  Plan:   - Discussed importance of vitamin D (as well as calcium) to their health and well-being.   - We reviewed possible symptoms of low Vitamin D including low energy, depressed mood, muscle aches, joint aches, osteoporosis etc.  - We discussed that low Vitamin D levels may be linked to an increased risk of cardiovascular events and even increased risk of cancers- such as colon and breast.   - Educated pt that weight loss will likely improve availability of vitamin D, thus encouraged Rebecca Olson to continue with meal plan and their weight loss efforts to further improve this condition  - I recommend pt take a weekly prescription vit D- see script below- which pt agrees to after discussion of risks and benefits of this medication.      - Informed patient this may be a lifelong thing, and she was encouraged to continue to take the medicine until pt told otherwise.  We will need to monitor levels regularly ( q 3-4 mo on average )  to keep levels within normal limits.   - All pt's questions and concerns regarding this condition addressed  3. Class 2 severe obesity with serious comorbidity and body mass index (BMI) of 37.0 to 37.9 in adult, unspecified obesity type [facility])  Rebecca Olson is currently in the action stage of change. As such, her goal is to continue with weight loss efforts. She has agreed to the Category 2 Plan.   Exercise goals: Increase exercise to 3 days per week for  30-45 minutes.  Behavioral modification strategies: increasing lean protein intake, decreasing simple carbohydrates, increasing water intake, no skipping meals and planning for success.  [name] has agreed to follow-up with our clinic in 2 weeks. She was informed of the importance of frequent follow-up visits to maximize her success with intensive lifestyle modifications for her multiple health conditions.   Objective:   Blood pressure 115/80, pulse 82, temperature 98.3 F (36.8 C), height 5\' 2"  (1.575 m), weight 203 lb (92.1 kg), SpO2 100 %. Body mass index is 37.13 kg/m.  General: Cooperative, alert, well developed, in no acute distress. HEENT: Conjunctivae and lids unremarkable. Cardiovascular: Regular rhythm.  Lungs: Normal work of breathing. Neurologic: No focal deficits.   Lab Results  Component Value Date   CREATININE 0.79 08/15/2019   BUN 14 08/15/2019   NA 140 08/15/2019   K 4.7 08/15/2019   CL 105 08/15/2019   CO2 18 (L) 08/15/2019   Lab Results  Component Value Date   ALT 15 09/04/2019   AST 15 09/04/2019   ALKPHOS 133 (H) 09/04/2019   BILITOT 0.3 09/04/2019   Lab Results  Component Value Date   HGBA1C 6.3 (A) 08/15/2019   Lab Results  Component Value Date   INSULIN 71.0 (H) 09/04/2019   Lab Results  Component Value Date   TSH 1.670 09/04/2019   Lab Results  Component Value Date   CHOL 193 08/15/2019   HDL 37 (L) 08/15/2019   LDLCALC 125 (H) 08/15/2019   TRIG 172 (H) 08/15/2019   CHOLHDL 5.2 (H) 08/15/2019   Lab Results  Component Value Date   WBC 8.8 09/04/2019   HGB 12.8 09/04/2019   HCT 39.6 09/04/2019   MCV 72 (L) 09/04/2019   PLT 322 09/04/2019   Attestation Statements:   Reviewed by clinician on day of visit: allergies, medications, problem list, medical history, surgical history, family history, social history, and previous encounter notes.  Time spent on visit including pre-visit chart review and post-visit care and charting was 22  minutes.   I, Water quality scientist, CMA, am acting as Location manager for [employer], DO.  I have reviewed the above documentation for accuracy and completeness, and I agree with the above. Marjory Sneddon, D.O.  The [place] was signed into law in 2016 which includes the topic of electronic health records.  This provides immediate access to information in MyChart.  This includes consultation notes, operative notes, office notes, lab results and pathology reports.  If you have any questions about what you read please let us know at your next visit so we can discuss your concerns and take corrective action if need be.  We are right here with you.

## 2019-11-19 ENCOUNTER — Ambulatory Visit (INDEPENDENT_AMBULATORY_CARE_PROVIDER_SITE_OTHER): Payer: No Typology Code available for payment source | Admitting: Family Medicine

## 2019-11-19 ENCOUNTER — Other Ambulatory Visit: Payer: Self-pay

## 2019-11-19 ENCOUNTER — Encounter (INDEPENDENT_AMBULATORY_CARE_PROVIDER_SITE_OTHER): Payer: Self-pay | Admitting: Family Medicine

## 2019-11-19 VITALS — BP 112/75 | HR 67 | Temp 98.3°F | Ht 62.0 in | Wt 202.0 lb

## 2019-11-19 DIAGNOSIS — R7303 Prediabetes: Secondary | ICD-10-CM

## 2019-11-19 DIAGNOSIS — J3089 Other allergic rhinitis: Secondary | ICD-10-CM

## 2019-11-19 DIAGNOSIS — Z9189 Other specified personal risk factors, not elsewhere classified: Secondary | ICD-10-CM

## 2019-11-19 DIAGNOSIS — E559 Vitamin D deficiency, unspecified: Secondary | ICD-10-CM

## 2019-11-19 DIAGNOSIS — Z6837 Body mass index (BMI) 37.0-37.9, adult: Secondary | ICD-10-CM

## 2019-11-19 MED ORDER — LEVOCETIRIZINE DIHYDROCHLORIDE 5 MG PO TABS: ORAL_TABLET | ORAL | 0 refills | Status: DC

## 2019-11-19 MED ORDER — MONTELUKAST SODIUM 10 MG PO TABS: ORAL_TABLET | ORAL | 0 refills | Status: DC

## 2019-11-19 MED ORDER — VITAMIN D (ERGOCALCIFEROL) 1.25 MG (50000 UNIT) PO CAPS: ORAL_CAPSULE | ORAL | 0 refills | Status: DC

## 2019-11-21 NOTE — Progress Notes (Signed)
Chief Complaint:   OBESITY Rebecca Olson is here to discuss her progress with her obesity treatment plan along with follow-up of her obesity related diagnoses. [name] is on the Category 2 Plan and states she is following her eating plan approximately 60-75% of the time. [name] states she is walking for 30 minutes 1 times per week.  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  This is causing abdominal bloating as well.  She has had these symptoms in the past (nothing new) but has not taken anything for it.  She went a little off plan and ate some cake/[employer] cups that she did not count against snack calories.    Plan:  In 4 weeks, we will obtain A1c and vitamin D level.  Assessment/Plan:   1. Environmental and seasonal allergies She tried nasal rinses, but used 2 packets instead of one and it caused nasal running.  She is tolerating medications well.  Symptoms are well controlled on medications.  Taking Singulair and Xyzal.  Plan:  Will refill Singulair and Xyzal today, as per below.  -Refill montelukast (SINGULAIR) 10 MG tablet; TAKE 1 TABLET BY MOUTH EVERYDAY AT BEDTIME  Dispense: 30 tablet; Refill: 0 -Refill levocetirizine (XYZAL) 5 MG tablet; TAKE 1 TABLET BY MOUTH EVERY DAY IN THE EVENING  Dispense: 30 tablet; Refill: 0  2. Prediabetes Panzy has a diagnosis of prediabetes based on her elevated HgA1c and was informed this puts her at greater risk of developing diabetes. She continues to work on diet and exercise to decrease her risk of diabetes. She denies nausea or hypoglycemia.  A1c was 6.3 when last checked.    Plan:  Recheck A1c in  3-4 months.  She would like to work on diet/exercise for a couple of office visits and then recheck.  Lab Results  Component Value Date   HGBA1C 6.3 (A) 08/15/2019   Lab Results  Component Value Date   INSULIN 71.0 (H) 09/04/2019   3. Vitamin D deficiency Rebecca Olson has a history of Vitamin D deficiency with resultant generalized fatigue as her primary symptom.  she is taking vitamin D 50,000 IU weekly for this deficiency and tolerating it well without side-effect.  Most recent Vitamin D lab reviewed-  level: 23.6 on 09/04/2019.  Plan:  - Discussed importance of vitamin D (as well as calcium) to their health and wellbeing.   - Educated pt that weight loss will likely improve availability of vitamin D, thus encouraged Rebecca Olson to continue with meal plan and their weight loss efforts to further improve this condition.  - Informed patient this may be a lifelong thing, and she was encouraged to continue to take the medicine until told otherwise.  We will need to monitor levels regularly (every 3-4 mo on average) to keep levels within normal limits.   - All pt's questions and concerns regarding this condition addressed.  -Refill Vitamin D, Ergocalciferol, (DRISDOL) 1.25 MG (50000 UNIT) CAPS capsule; Take one tablet wkly  Dispense: 4 capsule; Refill: 0   4. At risk for diabetes mellitus - Rebecca Olson was given extensive diabetes prevention education and counseling today of  more than 8 minutes.  - Counseled patient on pathophysiology of disease and discussed various treatment options which always includes dietary and lifestyle modification as first line.   - Importance of healthy diet with very limited amounts of simple carbohydrates discussed with patient in addition to regular aerobic exercise to an eventual goal of 61min 5d/week or more.  - Handouts provided at patient's desire and or told to go online at the American Diabetes Association website for further information  5. Class 2 severe obesity  with serious comorbidity and body mass index (BMI) of 37.0 to 37.9 in adult, unspecified obesity type [facility])  [name] is currently in the action stage of change. As such, her goal is to continue with weight loss efforts. She has agreed to the Category 2 Plan.   Exercise goals: For substantial health benefits, adults should do at least 150 minutes (2 hours and 30 minutes) a week of moderate-intensity, or 75 minutes (1 hour and 15 minutes) a week of vigorous-intensity aerobic physical activity, or an equivalent combination of moderate- and vigorous-intensity aerobic activity. Aerobic activity should be performed in episodes of at least 10 minutes, and preferably, it should be spread throughout the week.  Behavioral modification strategies: no skipping meals and planning for success.  Rebecca Olson has agreed to follow-up with our clinic in 2 weeks. She was informed of the importance of frequent follow-up visits to maximize her success with intensive lifestyle modifications for her multiple health conditions.   Objective:   Blood pressure 112/75, pulse 67, temperature 98.3 F (36.8 C), height 5\' 2"  (1.575 m), weight 202 lb (91.6 kg), SpO2 100 %. Body mass index is 36.95 kg/m.  General: Cooperative, alert, well developed, in no acute distress. HEENT: Conjunctivae and lids unremarkable. Cardiovascular: Regular rhythm.  Lungs: Normal work of breathing. Neurologic: No focal deficits.   Lab Results  Component Value Date   CREATININE 0.79 08/15/2019   BUN 14 08/15/2019   NA 140 08/15/2019   K 4.7 08/15/2019   CL 105 08/15/2019   CO2 18 (L) 08/15/2019   Lab Results  Component Value Date   ALT 15 09/04/2019   AST 15 09/04/2019   ALKPHOS 133 (H) 09/04/2019   BILITOT 0.3 09/04/2019   Lab Results  Component Value Date   HGBA1C 6.3 (A) 08/15/2019   Lab Results  Component Value Date   INSULIN 71.0 (H) 09/04/2019   Lab Results  Component Value Date   TSH 1.670 09/04/2019   Lab Results    Component Value Date   CHOL 193 08/15/2019   HDL 37 (L) 08/15/2019   LDLCALC 125 (H) 08/15/2019   TRIG 172 (H) 08/15/2019   CHOLHDL 5.2 (H) 08/15/2019   Lab Results  Component Value Date   WBC 8.8 09/04/2019   HGB 12.8 09/04/2019   HCT 39.6 09/04/2019   MCV 72 (L) 09/04/2019   PLT 322 09/04/2019   Attestation Statements:   Reviewed by clinician on day of visit: allergies, medications, problem list, medical history, surgical history, family history, social history, and previous encounter notes.  I, Water quality scientist, CMA, am acting as Location manager for [employer], DO.  I have reviewed the above documentation for accuracy and completeness, and I agree with the above. -  *Marjory Sneddon, D.O.  The [place] was signed into law in 2016 which includes the topic of electronic health records.  This provides immediate access to information in MyChart.  This includes consultation notes, operative notes, office notes, lab results  and pathology reports.  If you have any questions about what you read please let us know at your next visit so we can discuss your concerns and take corrective action if need be.  We are right here with you.

## 2019-12-03 ENCOUNTER — Encounter (INDEPENDENT_AMBULATORY_CARE_PROVIDER_SITE_OTHER): Payer: Self-pay | Admitting: Family Medicine

## 2019-12-03 ENCOUNTER — Other Ambulatory Visit: Payer: Self-pay

## 2019-12-03 ENCOUNTER — Ambulatory Visit (INDEPENDENT_AMBULATORY_CARE_PROVIDER_SITE_OTHER): Payer: No Typology Code available for payment source | Admitting: Family Medicine

## 2019-12-03 VITALS — BP 109/72 | HR 90 | Temp 98.0°F | Ht 62.0 in | Wt 201.0 lb

## 2019-12-03 DIAGNOSIS — Z6836 Body mass index (BMI) 36.0-36.9, adult: Secondary | ICD-10-CM | POA: Diagnosis not present

## 2019-12-03 DIAGNOSIS — E559 Vitamin D deficiency, unspecified: Secondary | ICD-10-CM | POA: Diagnosis not present

## 2019-12-03 DIAGNOSIS — R7303 Prediabetes: Secondary | ICD-10-CM | POA: Diagnosis not present

## 2019-12-03 DIAGNOSIS — Z9189 Other specified personal risk factors, not elsewhere classified: Secondary | ICD-10-CM

## 2019-12-03 MED ORDER — TOPIRAMATE 25 MG PO TABS: 25.0000 mg | ORAL_TABLET | Freq: Two times a day (BID) | ORAL | 0 refills | Status: DC

## 2019-12-05 NOTE — Progress Notes (Signed)
Chief Complaint:   OBESITY Rebecca Olson is here to discuss her progress with her obesity treatment plan along with follow-up of her obesity related diagnoses. Rebecca Olson is on the Category 2 Plan and states she is following her eating plan approximately 75% of the time. Rebecca Olson states she is walking for 60 minutes 3 times per week.  Today's visit was #: 7 Starting weight: 208 lbs Starting date: 09/04/2019 Today's weight: 201 lbs Today's date: 11/02/2019 Total lbs lost to date: 7 lbs Total lbs lost since last in-office visit: 1 lb Total weight loss percentage to date: -3.37%   Interim History:  Rebecca Olson says she has been having more cravings lately, especially for sweets.  This is "only on days when I do not eat on plan".  Weighing proteins.  She thinks she is doing emotional eating and having increased cravings that she says come out of nowhere.  Cravings are the worst.    Plan:  Due to increased cravings, start low dose topiramate.  Eat all foods on plan without skipping food/meals.   Assessment/Plan:   Orders Placed This Encounter  Procedures  . Hemoglobin A1c  . Insulin, random  . VITAMIN D 25 Hydroxy (Vit-D Deficiency, Fractures)    Meds ordered this encounter  Medications  . topiramate (TOPAMAX) 25 MG tablet    Sig: Take 1 tablet (25 mg total) by mouth 2 (two) times daily.    Dispense:  60 tablet    Refill:  0     1. Vitamin D deficiency Rebecca Olson's Vitamin D level was 23.6 on 09/04/2019. She is currently taking prescription vitamin D 50,000 IU each week. She denies nausea, vomiting or muscle weakness.  Plan:  Recheck vitamin D level at next office visit.  Continue vitamin D as written.  - VITAMIN D 25 Hydroxy (Vit-D Deficiency, Fractures)    2. Prediabetes Rebecca Olson has a diagnosis of prediabetes based on her elevated HgA1c and was informed this puts her at greater risk of developing diabetes. She continues to work on diet and exercise to decrease her risk of diabetes. She denies  nausea or hypoglycemia.  Plan:  At next office visit, check A1c and fasting insulin.  Lab Results  Component Value Date   HGBA1C 6.3 (A) 08/15/2019   Lab Results  Component Value Date   INSULIN 71.0 (H) 09/04/2019   - Hemoglobin A1c - Insulin, random    3. At risk for diabetes mellitus - Rebecca Olson was given extensive diabetes prevention education and counseling today of more than 10 minutes.  - Counseled patient on pathophysiology of disease and discussed various treatment options which always includes dietary and lifestyle modification as first line.   - Importance of healthy diet with very limited amounts of simple carbohydrates discussed with patient in addition to regular aerobic exercise to an eventual goal of 8min 5d/week or more.  - Handouts provided at patient's desire and or told to go online at the American Diabetes Association website for further information    4. Class 2 severe obesity with serious comorbidity and body mass index (BMI) of 36.0 to 36.9 in adult, unspecified obesity type (HCC)  -Start topiramate (TOPAMAX) 25 MG tablet; Take 1 tablet (25 mg total) by mouth 2 (two) times daily.  Dispense: 60 tablet; Refill: 0  Rebecca Olson is currently in the action stage of change. As such, her goal is to continue with weight loss efforts. She has agreed to the Category 2 Plan.   Exercise goals: As is.  [place]  minutes/week or more.  Behavioral modification strategies: no skipping meals, keeping healthy foods in the home and planning for success.  Rebecca Olson has agreed to follow-up with our clinic in 2 weeks since starting her on Topamax. She was informed of the importance of frequent follow-up visits to maximize her success with intensive lifestyle modifications for her multiple health conditions.   Rebecca Olson was informed we would discuss her lab results at her next visit unless there is a critical issue that needs to be addressed sooner. Rebecca Olson agreed to keep her next visit at the agreed upon  time to discuss these results.    Objective:   Blood pressure 109/72, pulse 90, temperature 98 F (36.7 C), height 5\' 2"  (1.575 m), weight 201 lb (91.2 kg), SpO2 99 %. Body mass index is 36.76 kg/m.  General: Cooperative, alert, well developed, in no acute distress. HEENT: Conjunctivae and lids unremarkable. Cardiovascular: Regular rhythm.  Lungs: Normal work of breathing. Neurologic: No focal deficits.   Lab Results  Component Value Date   CREATININE 0.79 08/15/2019   BUN 14 08/15/2019   NA 140 08/15/2019   K 4.7 08/15/2019   CL 105 08/15/2019   CO2 18 (L) 08/15/2019   Lab Results  Component Value Date   ALT 15 09/04/2019   AST 15 09/04/2019   ALKPHOS 133 (H) 09/04/2019   BILITOT 0.3 09/04/2019   Lab Results  Component Value Date   HGBA1C 6.3 (A) 08/15/2019   Lab Results  Component Value Date   INSULIN 71.0 (H) 09/04/2019   Lab Results  Component Value Date   TSH 1.670 09/04/2019   Lab Results  Component Value Date   CHOL 193 08/15/2019   HDL 37 (L) 08/15/2019   LDLCALC 125 (H) 08/15/2019   TRIG 172 (H) 08/15/2019   CHOLHDL 5.2 (H) 08/15/2019   Lab Results  Component Value Date   WBC 8.8 09/04/2019   HGB 12.8 09/04/2019   HCT 39.6 09/04/2019   MCV 72 (L) 09/04/2019   PLT 322 09/04/2019   Attestation Statements:   Reviewed by clinician on day of visit: allergies, medications, problem list, medical history, surgical history, family history, social history, and previous encounter notes.  I, Water quality scientist, CMA, am acting as Location manager for [employer], DO.  I have reviewed the above documentation for accuracy and completeness, and I agree with the above. Marjory Sneddon, D.O.  The [place] was signed into law in 2016 which includes the topic of electronic health records.  This provides immediate access to information in MyChart.  This includes consultation notes, operative notes, office notes, lab results and pathology  reports.  If you have any questions about what you read please let us know at your next visit so we can discuss your concerns and take corrective action if need be.  We are right here with you.

## 2019-12-18 ENCOUNTER — Other Ambulatory Visit: Payer: Self-pay

## 2019-12-18 ENCOUNTER — Encounter (INDEPENDENT_AMBULATORY_CARE_PROVIDER_SITE_OTHER): Payer: Self-pay | Admitting: Family Medicine

## 2019-12-18 ENCOUNTER — Ambulatory Visit (INDEPENDENT_AMBULATORY_CARE_PROVIDER_SITE_OTHER): Payer: No Typology Code available for payment source | Admitting: Family Medicine

## 2019-12-18 VITALS — BP 106/58 | HR 86 | Temp 98.2°F | Ht 62.0 in | Wt 197.0 lb

## 2019-12-18 DIAGNOSIS — D508 Other iron deficiency anemias: Secondary | ICD-10-CM

## 2019-12-18 DIAGNOSIS — R1013 Epigastric pain: Secondary | ICD-10-CM

## 2019-12-18 DIAGNOSIS — Z9189 Other specified personal risk factors, not elsewhere classified: Secondary | ICD-10-CM | POA: Insufficient documentation

## 2019-12-18 DIAGNOSIS — F430 Acute stress reaction: Secondary | ICD-10-CM | POA: Insufficient documentation

## 2019-12-18 DIAGNOSIS — R7303 Prediabetes: Secondary | ICD-10-CM | POA: Diagnosis not present

## 2019-12-18 DIAGNOSIS — E559 Vitamin D deficiency, unspecified: Secondary | ICD-10-CM

## 2019-12-18 DIAGNOSIS — Z6836 Body mass index (BMI) 36.0-36.9, adult: Secondary | ICD-10-CM

## 2019-12-18 DIAGNOSIS — D649 Anemia, unspecified: Secondary | ICD-10-CM | POA: Insufficient documentation

## 2019-12-18 MED ORDER — FERROUS SULFATE 325 (65 FE) MG PO TABS: 325.0000 mg | ORAL_TABLET | Freq: Every day | ORAL | 0 refills | Status: DC

## 2019-12-18 MED ORDER — VITAMIN D (ERGOCALCIFEROL) 1.25 MG (50000 UNIT) PO CAPS: ORAL_CAPSULE | ORAL | 0 refills | Status: DC

## 2019-12-19 LAB — HEMOGLOBIN A1C
Est. average glucose Bld gHb Est-mCnc: 143 mg/dL
Hgb A1c MFr Bld: 6.6 % — ABNORMAL HIGH (ref 4.8–5.6)

## 2019-12-19 LAB — VITAMIN D 25 HYDROXY (VIT D DEFICIENCY, FRACTURES): Vit D, 25-Hydroxy: 55.3 ng/mL (ref 30.0–100.0)

## 2019-12-19 LAB — INSULIN, RANDOM: INSULIN: 15.1 u[IU]/mL (ref 2.6–24.9)

## 2019-12-24 NOTE — Progress Notes (Signed)
Chief Complaint:   OBESITY Rebecca Olson is here to discuss her progress with her obesity treatment plan along with follow-up of her obesity related diagnoses. Rebecca Olson is on the Category 2 Plan and states she is following her eating plan approximately 90% of the time. [name] states she is walking 120 minutes 3 times per week.  Today's visit was #: 8 Starting weight: 208 lbs Starting date: 09/04/2019 Today's weight: 197 lbs Today's date: 12/18/2019 Total lbs lost to date: 11 lbs Total lbs lost since last in-office visit: 4 lbs Total weight loss percentage to date: -5.29%  Interim History: Rebecca Olson cut out her sugars and is doing better with plan. She is not eating out, but skips a meal every other day or so. She notes dinner is the most difficult. She started Topamax at her last office visit. She is tolerating it well, without side effects. Rebecca Olson has decreased cravings and decreased hunger.  Plan: Continue Topamax 25 mg twice a day. Check labs today  Assessment/Plan:   1. Other iron deficiency anemia [name] is on prescription ferrous sulfate. She denies side effects. No constipation. No change in energy levels.   CBC Latest Ref Rng & Units 09/04/2019 01/03/2019 07/03/2013  WBC 3.4 - 10.8 x10E3/uL 8.8 11.6(H) 10.4  Hemoglobin 11.1 - 15.9 g/dL 12.8 12.1 12.4  Hematocrit 34.0 - 46.6 % 39.6 35.9 35.8(L)  Platelets 150 - 450 x10E3/uL 322 379 302   Lab Results  Component Value Date   VITAMINB12 615 09/04/2019   Plan: Refill ferrous sulfate, as per below.   Refill- ferrous sulfate 325 (65 FE) MG tablet; Take 1 tablet (325 mg total) by mouth daily with breakfast.  Dispense: 30 tablet; Refill: 0  2. Vitamin D deficiency Rebecca Olson's Vitamin D level was 5.49 on 09/04/2019. She is currently taking prescription vitamin D 50,000 IU each week. She denies nausea, vomiting or muscle weakness.  Plan: Refill Vit D for 1 month. Also, check Vit D level today.  Refill- Vitamin D, Ergocalciferol, (DRISDOL) 1.25 MG  (50000 UNIT) CAPS capsule; Take one tablet wkly  Dispense: 4 capsule; Refill: 0  Labs obtained today: - VITAMIN D 25 Hydroxy (Vit-D Deficiency, Fractures)  3. Stress reaction with emotional eating Rebecca Olson reports a lot of stress with her living situation and trying to move into another place (not clean, not safe, etc.). She denies depression or suicidal ideations.   Plan: Sherald declines medication and doesn't feel it's bad at this time but realizes it is a challenge for her to be healthy.  4. Abdominal pain, epigastric Glennie continues to have dull aches in her abdomen. She is seen her PCP for this in recent past. This is a long standing problem for Triston. Her PCP sent her to GI, but Rebecca Olson has not seen them yet. She denies constipation. No vomiting but does report some nausea.  Plan: Treatment plan per PCP. Adrien will call them about her GI referral. Likely stress reaction related to IBS after our discussion today. I told Mystery to consider Zantac OTC as needed.   5. Pre-diabetes Rebecca Olson has a diagnosis of prediabetes based on her elevated HgA1c and was informed this puts her at greater risk of developing diabetes. She continues to work on diet and exercise to decrease her risk of diabetes. She denies nausea or hypoglycemia.  Lab Results  Component Value Date   HGBA1C 6.6 (H) 12/18/2019   Lab Results  Component Value Date   INSULIN 15.1 12/18/2019   INSULIN 71.0 (H) 09/04/2019  Plan: Check labs today  Labs obtained: - Hemoglobin A1c - Insulin, random  6. At risk for depression Rebecca Olson was given approximately 14 minutes of depression risk counseling today. She has risk factors for depression. We discussed the importance of a healthy work life balance, a healthy relationship with food and a good support system.  7. Class 2 severe obesity with serious comorbidity and body mass index (BMI) of 36.0 to 36.9 in adult, unspecified obesity type [facility]) Rebecca Olson is currently in the action stage of  change. As such, her goal is to continue with weight loss efforts. She has agreed to the Category 2 Plan.   Exercise goals: As is  Behavioral modification strategies: increasing lean protein intake, no skipping meals, meal planning and cooking strategies and planning for success.  Rebecca Olson has agreed to follow-up with our clinic in 2-3 weeks. We will discuss labs at that time. She was informed of the importance of frequent follow-up visits to maximize her success with intensive lifestyle modifications for her multiple health conditions.   Rebecca Olson was informed we would discuss her lab results at her next visit unless there is a critical issue that needs to be addressed sooner. Man agreed to keep her next visit at the agreed upon time to discuss these results.  Objective:   Blood pressure (!) 106/58, pulse 86, temperature 98.2 F (36.8 C), height 5\' 2"  (1.575 m), weight 197 lb (89.4 kg), SpO2 100 %. Body mass index is 36.03 kg/m.  General: Cooperative, alert, well developed, in no acute distress. HEENT: Conjunctivae and lids unremarkable. Cardiovascular: Regular rhythm.  Lungs: Normal work of breathing. Neurologic: No focal deficits.   Lab Results  Component Value Date   CREATININE 0.79 08/15/2019   BUN 14 08/15/2019   NA 140 08/15/2019   K 4.7 08/15/2019   CL 105 08/15/2019   CO2 18 (L) 08/15/2019   Lab Results  Component Value Date   ALT 15 09/04/2019   AST 15 09/04/2019   ALKPHOS 133 (H) 09/04/2019   BILITOT 0.3 09/04/2019   Lab Results  Component Value Date   HGBA1C 6.6 (H) 12/18/2019   HGBA1C 6.3 (A) 08/15/2019   Lab Results  Component Value Date   INSULIN 15.1 12/18/2019   INSULIN 71.0 (H) 09/04/2019   Lab Results  Component Value Date   TSH 1.670 09/04/2019   Lab Results  Component Value Date   CHOL 193 08/15/2019   HDL 37 (L) 08/15/2019   LDLCALC 125 (H) 08/15/2019   TRIG 172 (H) 08/15/2019   CHOLHDL 5.2 (H) 08/15/2019   Lab Results  Component Value  Date   WBC 8.8 09/04/2019   HGB 12.8 09/04/2019   HCT 39.6 09/04/2019   MCV 72 (L) 09/04/2019   PLT 322 09/04/2019    Attestation Statements:   Reviewed by clinician on day of visit: allergies, medications, problem list, medical history, surgical history, family history, social history, and previous encounter notes.  Coral Ceo, am acting as Location manager for [employer], DO.  I have reviewed the above documentation for accuracy and completeness, and I agree with the above. Marjory Sneddon, D.O.  The [place] was signed into law in 2016 which includes the topic of electronic health records.  This provides immediate access to information in MyChart.  This includes consultation notes, operative notes, office notes, lab results and pathology reports.  If you have any questions about what you read please let us know at your next visit  so we can discuss your concerns and take corrective action if need be.  We are right here with you.

## 2019-12-31 ENCOUNTER — Other Ambulatory Visit: Payer: Self-pay

## 2019-12-31 ENCOUNTER — Ambulatory Visit (INDEPENDENT_AMBULATORY_CARE_PROVIDER_SITE_OTHER): Payer: No Typology Code available for payment source | Admitting: Family Medicine

## 2019-12-31 ENCOUNTER — Encounter (INDEPENDENT_AMBULATORY_CARE_PROVIDER_SITE_OTHER): Payer: Self-pay | Admitting: Family Medicine

## 2019-12-31 VITALS — BP 118/74 | HR 79 | Temp 98.2°F | Ht 62.0 in | Wt 196.0 lb

## 2019-12-31 DIAGNOSIS — Z6835 Body mass index (BMI) 35.0-35.9, adult: Secondary | ICD-10-CM

## 2019-12-31 DIAGNOSIS — E611 Iron deficiency: Secondary | ICD-10-CM | POA: Diagnosis not present

## 2019-12-31 DIAGNOSIS — Z9189 Other specified personal risk factors, not elsewhere classified: Secondary | ICD-10-CM | POA: Insufficient documentation

## 2019-12-31 DIAGNOSIS — K5909 Other constipation: Secondary | ICD-10-CM | POA: Insufficient documentation

## 2019-12-31 DIAGNOSIS — E559 Vitamin D deficiency, unspecified: Secondary | ICD-10-CM | POA: Diagnosis not present

## 2019-12-31 DIAGNOSIS — E1169 Type 2 diabetes mellitus with other specified complication: Secondary | ICD-10-CM

## 2019-12-31 DIAGNOSIS — E119 Type 2 diabetes mellitus without complications: Secondary | ICD-10-CM | POA: Insufficient documentation

## 2019-12-31 MED ORDER — TOPIRAMATE 25 MG PO TABS: 25.0000 mg | ORAL_TABLET | Freq: Two times a day (BID) | ORAL | 0 refills | Status: DC

## 2020-01-01 NOTE — Progress Notes (Signed)
Chief Complaint:   OBESITY Rebecca Olson is here to discuss her progress with her obesity treatment plan along with follow-up of her obesity related diagnoses. Rebecca Olson is on the Category 2 Plan and states she is following her eating plan approximately 90-95% of the time. Rebecca Olson states she is walkign 30 minutes 2 times per week.  Today's visit was #: 8 Starting weight: 09/04/2019 Starting date: 208 lbs Today's weight: 196 lbs Today's date: 12/31/2019 Total lbs lost to date: 196 lbs Total lbs lost since last in-office visit: 1 lb Total weight loss percentage to date: -5.77%  Interim History: Rebecca Olson reports that she is moving to a new apartment on December 16th. She is looking forward to  Being able to care for herself better. She notes that she skips one meal a day on average.  Plan: Refill Topamax for 30 day supply to help with cravings.  Assessment/Plan:   1. Vitamin D deficiency Discussed labs with patient today. Rebecca Olson's Vitamin D level was 55.3 on 12/18/2019. She is currently taking prescription vitamin D 50,000 IU each week. She denies nausea, vomiting or muscle weakness.  Plan: Continue current treatment plan of Vit D. Low Vitamin D level contributes to fatigue and are associated with obesity, breast, and colon cancer. She agrees to continue to take prescription Vitamin D @50 ,000 IU every week and will follow-up for routine testing of Vitamin D, at least 2-3 times per year to avoid over-replacement.  2. Type 2 diabetes mellitus with other specified complication, without long-term current use of insulin (Rebecca Olson) New. Discussed labs with patient today. Rebecca Olson reports that her mom and sister also have diabetes, but she was never told that she was diabetic before.  Medications reviewed. Diabetic ROS: no polyuria or polydipsia, no chest pain, dyspnea or TIA's, no numbness, tingling or pain in extremities.   Lab Results  Component Value Date   HGBA1C 6.6 (H) 12/18/2019   HGBA1C 6.3 (A) 08/15/2019    Lab Results  Component Value Date   LDLCALC 125 (H) 08/15/2019   CREATININE 0.79 08/15/2019   Lab Results  Component Value Date   INSULIN 15.1 12/18/2019   INSULIN 71.0 (H) 09/04/2019   Plan: Rebecca Olson and I had a long discussion regarding Metformin versus GLP-1. She wishes to hold off on medication for now. She will discuss further plan of care with her PCP.  3. Iron deficiency Rebecca Olson takes ferrous sulfate and reports that she takes it most days.    CBC Latest Ref Rng & Units 09/04/2019 01/03/2019 07/03/2013  WBC 3.4 - 10.8 x10E3/uL 8.8 11.6(H) 10.4  Hemoglobin 11.1 - 15.9 g/dL 12.8 12.1 12.4  Hematocrit 34.0 - 46.6 % 39.6 35.9 35.8(L)  Platelets 150 - 450 x10E3/uL 322 379 302   No results found for: IRON, TIBC, FERRITIN Lab Results  Component Value Date   Rebecca Olson 176 09/04/2019   Plan: Orders and follow up as documented in patient record.  Counseling . Iron is essential for our bodies to make red blood cells.  Reasons that someone may be deficient include: an iron-deficient diet (more likely in those following vegan or vegetarian diets), women with heavy menses, patients with GI disorders or poor absorption, patients that have had bariatric surgery, frequent blood donors, patients with cancer, and patients with heart disease.   Rebecca Olson foods include dark leafy greens, red and white meats, eggs, seafood, and beans.   . Certain foods and drinks prevent your body from absorbing iron properly. Avoid eating these foods in  the same meal as iron-rich foods or with iron supplements. These foods include: coffee, black tea, and red wine; milk, dairy products, and foods that are high in calcium; beans and soybeans; whole grains.  . Constipation can be a side effect of iron supplementation. Increased water and fiber intake are helpful. Water goal: > 2 liters/day. Fiber goal: > 25 grams/day.  4. Other constipation Rebecca Olson reports a new onset of constipation over the past 1-2 weeks. She drinks  100 oz of water daily.  Plan:  1. Start OTC Miralax daily and use as needed. 2. Obtain OTC Stool softener as well  5. At risk for hyperglycemia Rebecca Olson was given approximately 30 minutes of counseling today regarding prevention of hyperglycemia. She was advised of hyperglycemia causes and the fact hyperglycemia is often asymptomatic. Rebecca Olson was instructed to avoid skipping meals, eat regular protein rich meals and schedule low calorie but protein rich snacks as needed.   6. Class 2 severe obesity with serious comorbidity and body mass index (BMI) of 35.0 to 35.9 in adult, unspecified obesity type (HCC) - topiramate (TOPAMAX) 25 MG tablet; Take 1 tablet (25 mg total) by mouth 2 (two) times daily.  Dispense: 60 tablet; Refill: 0  Rebecca Olson is currently in the action stage of change. As such, her goal is to continue with weight loss efforts. She has agreed to change to the Category 3 Plan.   Exercise goals: For substantial health benefits, adults should do at least 150 minutes (2 hours and 30 minutes) a week of moderate-intensity, or 75 minutes (1 hour and 15 minutes) a week of vigorous-intensity aerobic physical activity, or an equivalent combination of moderate- and vigorous-intensity aerobic activity. Aerobic activity should be performed in episodes of at least 10 minutes, and preferably, it should be spread throughout the week.  Behavioral modification strategies: increasing lean protein intake, decreasing simple carbohydrates, no skipping meals and planning for success.  Rebecca Olson has agreed to follow-up with our clinic in 2 weeks. She was informed of the importance of frequent follow-up visits to maximize her success with intensive lifestyle modifications for her multiple health conditions.   Objective:   Blood pressure 118/74, pulse 79, temperature 98.2 F (36.8 C), height 5\' 2"  (1.575 m), weight 196 lb (88.9 kg), SpO2 99 %. Body mass index is 35.85 kg/m.  General: Cooperative, alert, well  developed, in no acute distress. HEENT: Conjunctivae and lids unremarkable. Cardiovascular: Regular rhythm.  Lungs: Normal work of breathing. Neurologic: No focal deficits.   Lab Results  Component Value Date   CREATININE 0.79 08/15/2019   BUN 14 08/15/2019   NA 140 08/15/2019   K 4.7 08/15/2019   CL 105 08/15/2019   CO2 18 (L) 08/15/2019   Lab Results  Component Value Date   ALT 15 09/04/2019   AST 15 09/04/2019   ALKPHOS 133 (H) 09/04/2019   BILITOT 0.3 09/04/2019   Lab Results  Component Value Date   HGBA1C 6.6 (H) 12/18/2019   HGBA1C 6.3 (A) 08/15/2019   Lab Results  Component Value Date   INSULIN 15.1 12/18/2019   INSULIN 71.0 (H) 09/04/2019   Lab Results  Component Value Date   TSH 1.670 09/04/2019   Lab Results  Component Value Date   CHOL 193 08/15/2019   HDL 37 (L) 08/15/2019   LDLCALC 125 (H) 08/15/2019   TRIG 172 (H) 08/15/2019   CHOLHDL 5.2 (H) 08/15/2019   Lab Results  Component Value Date   WBC 8.8 09/04/2019   HGB 12.8 09/04/2019  HCT 39.6 09/04/2019   MCV 72 (L) 09/04/2019   PLT 322 09/04/2019    Attestation Statements:   Reviewed by clinician on day of visit: allergies, medications, problem list, medical history, surgical history, family history, social history, and previous encounter notes.  Coral Ceo, am acting as Location manager for [employer], DO.  I have reviewed the above documentation for accuracy and completeness, and I agree with the above. Marjory Sneddon, D.O.  The [place] was signed into law in 2016 which includes the topic of electronic health records.  This provides immediate access to information in MyChart.  This includes consultation notes, operative notes, office notes, lab results and pathology reports.  If you have any questions about what you read please let us know at your next visit so we can discuss your concerns and take corrective action if need be.  We are right here with  you.

## 2020-01-08 ENCOUNTER — Ambulatory Visit: Payer: No Typology Code available for payment source | Admitting: Family Medicine

## 2020-01-14 ENCOUNTER — Ambulatory Visit (INDEPENDENT_AMBULATORY_CARE_PROVIDER_SITE_OTHER): Payer: No Typology Code available for payment source | Admitting: Physician Assistant

## 2020-01-15 ENCOUNTER — Ambulatory Visit (INDEPENDENT_AMBULATORY_CARE_PROVIDER_SITE_OTHER): Payer: No Typology Code available for payment source | Admitting: Family Medicine

## 2020-01-15 ENCOUNTER — Other Ambulatory Visit: Payer: Self-pay

## 2020-01-15 ENCOUNTER — Encounter (INDEPENDENT_AMBULATORY_CARE_PROVIDER_SITE_OTHER): Payer: Self-pay | Admitting: Family Medicine

## 2020-01-15 VITALS — BP 121/80 | HR 88 | Temp 98.3°F | Ht 62.0 in | Wt 191.0 lb

## 2020-01-15 DIAGNOSIS — E559 Vitamin D deficiency, unspecified: Secondary | ICD-10-CM

## 2020-01-15 DIAGNOSIS — Z9189 Other specified personal risk factors, not elsewhere classified: Secondary | ICD-10-CM

## 2020-01-15 DIAGNOSIS — E1169 Type 2 diabetes mellitus with other specified complication: Secondary | ICD-10-CM | POA: Diagnosis not present

## 2020-01-15 DIAGNOSIS — Z6835 Body mass index (BMI) 35.0-35.9, adult: Secondary | ICD-10-CM

## 2020-01-15 DIAGNOSIS — D508 Other iron deficiency anemias: Secondary | ICD-10-CM

## 2020-01-15 DIAGNOSIS — K5909 Other constipation: Secondary | ICD-10-CM | POA: Diagnosis not present

## 2020-01-15 DIAGNOSIS — J3089 Other allergic rhinitis: Secondary | ICD-10-CM

## 2020-01-15 MED ORDER — POLYETHYLENE GLYCOL 3350 17 G PO PACK: 17.0000 g | PACK | Freq: Two times a day (BID) | ORAL | 0 refills | Status: DC

## 2020-01-15 MED ORDER — SENNA-DOCUSATE SODIUM 8.6-50 MG PO TABS: 2.0000 | ORAL_TABLET | Freq: Every day | ORAL | 0 refills | Status: DC

## 2020-01-15 MED ORDER — LEVOCETIRIZINE DIHYDROCHLORIDE 5 MG PO TABS: ORAL_TABLET | ORAL | 0 refills | Status: DC

## 2020-01-15 MED ORDER — VITAMIN D (ERGOCALCIFEROL) 1.25 MG (50000 UNIT) PO CAPS: ORAL_CAPSULE | ORAL | 0 refills | Status: DC

## 2020-01-15 MED ORDER — FERROUS SULFATE 325 (65 FE) MG PO TABS: 325.0000 mg | ORAL_TABLET | Freq: Every day | ORAL | 0 refills | Status: DC

## 2020-01-15 MED ORDER — MONTELUKAST SODIUM 10 MG PO TABS: ORAL_TABLET | ORAL | 0 refills | Status: DC

## 2020-01-15 NOTE — Progress Notes (Signed)
Chief Complaint:   OBESITY Aleecia is here to discuss her progress with her obesity treatment plan along with follow-up of her obesity related diagnoses. Dustina is on the Category 3 Plan and states she is following her eating plan approximately 90% of the time. Alonda states she is exercising 0 minutes 0 times per week.  Today's visit was #: 9 Starting weight: 208 lbs Starting date: 09/04/2019 Today's weight: 191 lbs Today's date: 01/15/2020 Total lbs lost to date: 17 lbs Total lbs lost since last in-office visit: 5 lbs Total weight loss percentage to date: -8.17%  Interim History: Sherrine notes she feels more in control and able to stay on the plan more the last 2 weeks than prior. With her new diagnosis of diabetes mellitus 2, Iness is motivated for change and dong it.  Assessment/Plan:   1. Type 2 diabetes mellitus with other specified complication, without long-term current use of insulin [facility]) Bless is not prescribed medication at this time. Diabetic ROS: no polyuria or polydipsia, no chest pain, dyspnea or TIA's, no numbness, tingling or pain in extremities.   Lab Results  Component Value Date   HGBA1C 6.6 (H) 12/18/2019   HGBA1C 6.3 (A) 08/15/2019   Lab Results  Component Value Date   LDLCALC 125 (H) 08/15/2019   CREATININE 0.79 08/15/2019   Lab Results  Component Value Date   INSULIN 15.1 12/18/2019   INSULIN 71.0 (H) 09/04/2019   Plan: Due to the time of year, we will consider adding a medication for DM to help with weight loss at her next OV. Continue prudent nutritional plan and weight loss. We will monitor closely. Good blood sugar control is important to decrease the likelihood of diabetic complications such as nephropathy, neuropathy, limb loss, blindness, coronary artery disease, and death. Intensive lifestyle modification including diet, exercise and weight loss are the first line of treatment for diabetes.   2. Other constipation Jorden still complains of  constipation. She did not use the Miralax or OTC stool softener as directed last OV. She notes her stools are still hard and she is going infrequently. She denies abdominal pain and no fever, chills, or new symptoms. Christen notes constipation.   Plan:  1. Start Miralax twice a day  2. Start Senekot-S 2 tablets daily  Start- polyethylene glycol (MIRALAX / GLYCOLAX) 17 g packet; Take 17 g by mouth 2 (two) times daily. Until stooling regularly  Dispense: 60 packet; Refill: 0  Start- sennosides-docusate sodium (SENOKOT-S) 8.6-50 MG tablet; Take 2 tablets by mouth daily.  Dispense: 60 tablet; Refill: 0  3. Other iron deficiency anemia Cardelia is not a vegetarian.  She does not have a history of weight loss surgery.   CBC Latest Ref Rng & Units 09/04/2019 01/03/2019 07/03/2013  WBC 3.4 - 10.8 x10E3/uL 8.8 11.6(H) 10.4  Hemoglobin 11.1 - 15.9 g/dL 12.8 12.1 12.4  Hematocrit 34.0 - 46.6 % 39.6 35.9 35.8(L)  Platelets 150 - 450 x10E3/uL 322 379 302   No results found for: IRON, TIBC, FERRITIN Lab Results  Component Value Date   VITAMINB12 615 09/04/2019   Plan: Refill Ferrous sulfate for 1 month, as per below. Orders and follow up as documented in patient record.  Counseling . Iron is essential for our bodies to make red blood cells.  Reasons that someone may be deficient include: an iron-deficient diet (more likely in those following vegan or vegetarian diets), women with heavy menses, patients with GI disorders or poor absorption, patients that have  had bariatric surgery, frequent blood donors, patients with cancer, and patients with heart disease.   Marland Kitchen An iron supplement has been recommended. This is found over-the-counter. We recommend: Once Daily: Slow Fe 45mg  Iron Supplement for Iron Deficiency, Slow Release, High Potency, Easy to Swallow Tablets..  . Iron-rich foods include dark leafy greens, red and white meats, eggs, seafood, and beans.   . Certain foods and drinks prevent your body from  absorbing iron properly. Avoid eating these foods in the same meal as iron-rich foods or with iron supplements. These foods include: coffee, black tea, and red wine; milk, dairy products, and foods that are high in calcium; beans and soybeans; whole grains.  . Constipation can be a side effect of iron supplementation. Increased water and fiber intake are helpful. Water goal: > 2 liters/day. Fiber goal: > 25 grams/day.  Refill- ferrous sulfate 325 (65 FE) MG tablet; Take 1 tablet (325 mg total) by mouth daily with breakfast.  Dispense: 30 tablet; Refill: 0  4. Environmental and seasonal allergies  Terrence is on Singulair, Xyzal, and Astelin nasal spray for allergies.  Pt is tolerating medications well without side-effect or concern. Working well  Plan: Continue current treatment plan and refill Singulair and Xyzal for 1 month, as per below.   Refill- levocetirizine (XYZAL) 5 MG tablet; TAKE 1 TABLET BY MOUTH EVERY DAY IN THE EVENING  Dispense: 30 tablet; Refill: 0  Refill- montelukast (SINGULAIR) 10 MG tablet; TAKE 1 TABLET BY MOUTH EVERYDAY AT BEDTIME  Dispense: 30 tablet; Refill: 0  5. Vitamin D deficiency Dayshia's Vitamin D level was 55.3 on 12/18/2019. She is currently taking prescription vitamin D 50,000 IU each week. She denies nausea, vomiting or muscle weakness.   Ref. Range 12/18/2019 10:08  Vitamin D, 25-Hydroxy Latest Ref Range: 30.0 - 100.0 ng/mL 55.3   Plan: Refill Vit D for 1 month, as per below. Low Vitamin D level contributes to fatigue and are associated with obesity, breast, and colon cancer. She agrees to continue to take prescription Vitamin D @50 ,000 IU every week and will follow-up for routine testing of Vitamin D, at least 2-3 times per year to avoid over-replacement.  Refill- Vitamin D, Ergocalciferol, (DRISDOL) 1.25 MG (50000 UNIT) CAPS capsule; Take one tablet wkly  Dispense: 4 capsule; Refill: 0  6. At risk for deficient knowledge of diabetes mellitus Dainelle was given  approximately 27 minutes of diabetes education and counseling today. We discussed intensive lifestyle modifications today with an emphasis on weight loss as well as increasing exercise and decreasing simple carbohydrates in her diet. We also reviewed medication options with an emphasis on risk versus benefit of those discussed.   7. Class 2 severe obesity with serious comorbidity and body mass index (BMI) of 35.0 to 35.9 in adult, unspecified obesity type [facility]) Letta is currently in the action stage of change. As such, her goal is to continue with weight loss efforts. She has agreed to the Category 3 Plan.   Exercise goals: Milyn is encourage to start exercising. All adults should avoid inactivity. Some physical activity is better than none, and adults who participate in any amount of physical activity gain some health benefits.  Behavioral modification strategies: meal planning and cooking strategies, holiday eating strategies , celebration eating strategies and planning for success.  Nethra has agreed to follow-up with our clinic in 2-2.5 weeks. She was informed of the importance of frequent follow-up visits to maximize her success with intensive lifestyle modifications for her multiple health conditions.  Objective:   Blood pressure 121/80, pulse 88, temperature 98.3 F (36.8 C), height 5\' 2"  (1.575 m), weight 191 lb (86.6 kg), SpO2 100 %. Body mass index is 34.93 kg/m.  General: Cooperative, alert, well developed, in no acute distress. HEENT: Conjunctivae and lids unremarkable. Cardiovascular: Regular rhythm.  Lungs: Normal work of breathing. Neurologic: No focal deficits.   Lab Results  Component Value Date   CREATININE 0.79 08/15/2019   BUN 14 08/15/2019   NA 140 08/15/2019   K 4.7 08/15/2019   CL 105 08/15/2019   CO2 18 (L) 08/15/2019   Lab Results  Component Value Date   ALT 15 09/04/2019   AST 15 09/04/2019   ALKPHOS 133 (H) 09/04/2019   BILITOT 0.3 09/04/2019   Lab  Results  Component Value Date   HGBA1C 6.6 (H) 12/18/2019   HGBA1C 6.3 (A) 08/15/2019   Lab Results  Component Value Date   INSULIN 15.1 12/18/2019   INSULIN 71.0 (H) 09/04/2019   Lab Results  Component Value Date   TSH 1.670 09/04/2019   Lab Results  Component Value Date   CHOL 193 08/15/2019   HDL 37 (L) 08/15/2019   LDLCALC 125 (H) 08/15/2019   TRIG 172 (H) 08/15/2019   CHOLHDL 5.2 (H) 08/15/2019   Lab Results  Component Value Date   WBC 8.8 09/04/2019   HGB 12.8 09/04/2019   HCT 39.6 09/04/2019   MCV 72 (L) 09/04/2019   PLT 322 09/04/2019    Attestation Statements:   Reviewed by clinician on day of visit: allergies, medications, problem list, medical history, surgical history, family history, social history, and previous encounter notes.  Coral Ceo, am acting as Location manager for [employer], DO.  I have reviewed the above documentation for accuracy and completeness, and I agree with the above. Marjory Sneddon, D.O.  The [place] was signed into law in 2016 which includes the topic of electronic health records.  This provides immediate access to information in MyChart.  This includes consultation notes, operative notes, office notes, lab results and pathology reports.  If you have any questions about what you read please let us know at your next visit so we can discuss your concerns and take corrective action if need be.  We are right here with you.

## 2020-01-29 ENCOUNTER — Ambulatory Visit: Payer: No Typology Code available for payment source | Admitting: Family Medicine

## 2020-01-29 NOTE — Patient Instructions (Incomplete)
Thank you for coming to see me today. It was a pleasure.   Consider starting Metformin to help with weight loss [facility] with [facility] as scheduled  PAP due in 2022  You will be due for a colonoscopy in May of this year.  Please use the form that we have given you to schedule this at your convenience.   You are due for an eye exam.  Please make sure that you call your eye doctor to have this scheduled and have them fax the results to our office.  Please follow-up with me as needed  If you have any questions or concerns, please do not hesitate to call the office at [phone].  Best,   Dana Allan, MD  [facility]

## 2020-01-29 NOTE — Progress Notes (Deleted)
    SUBJECTIVE:   CHIEF COMPLAINT / HPI:  Discuss medications for weight loss    PERTINENT  PMH / PSH: ***  OBJECTIVE:   There were no vitals taken for this visit.   General: Alert, no acute distress Cardio: Normal S1 and S2, RRR, no r/m/g Pulm: CTAB, normal work of breathing Abdomen: Bowel sounds normal. Abdomen soft and non-tender.  Extremities: No peripheral edema.  Neuro: Cranial nerves grossly intact   ASSESSMENT/PLAN:   No problem-specific Assessment & Plan notes found for this encounter.     Dana Allan, MD [facility]

## 2020-02-04 ENCOUNTER — Ambulatory Visit (INDEPENDENT_AMBULATORY_CARE_PROVIDER_SITE_OTHER): Payer: No Typology Code available for payment source | Admitting: Family Medicine

## 2020-02-04 ENCOUNTER — Other Ambulatory Visit: Payer: Self-pay

## 2020-02-04 ENCOUNTER — Encounter (INDEPENDENT_AMBULATORY_CARE_PROVIDER_SITE_OTHER): Payer: Self-pay | Admitting: Family Medicine

## 2020-02-04 VITALS — BP 99/65 | HR 65 | Temp 98.1°F | Ht 62.0 in | Wt 191.0 lb

## 2020-02-04 DIAGNOSIS — E1169 Type 2 diabetes mellitus with other specified complication: Secondary | ICD-10-CM

## 2020-02-04 DIAGNOSIS — K5909 Other constipation: Secondary | ICD-10-CM | POA: Diagnosis not present

## 2020-02-04 DIAGNOSIS — Z9189 Other specified personal risk factors, not elsewhere classified: Secondary | ICD-10-CM | POA: Diagnosis not present

## 2020-02-04 DIAGNOSIS — E559 Vitamin D deficiency, unspecified: Secondary | ICD-10-CM | POA: Diagnosis not present

## 2020-02-04 DIAGNOSIS — Z6835 Body mass index (BMI) 35.0-35.9, adult: Secondary | ICD-10-CM

## 2020-02-04 MED ORDER — VITAMIN D (ERGOCALCIFEROL) 1.25 MG (50000 UNIT) PO CAPS: ORAL_CAPSULE | ORAL | 0 refills | Status: DC

## 2020-02-04 MED ORDER — TOPIRAMATE 50 MG PO TABS: 50.0000 mg | ORAL_TABLET | Freq: Two times a day (BID) | ORAL | 0 refills | Status: DC

## 2020-02-06 NOTE — Progress Notes (Signed)
Chief Complaint:   OBESITY Rebecca Olson is here to discuss her progress with her obesity treatment plan along with follow-up of her obesity related diagnoses. [name] is on the Category 3 Plan and states she is following her eating plan approximately 50% of the time. [name] states she is exercising 0 minutes 0 times per week.  Today's visit was #: 10 Starting weight: 208 lbs Starting date: 09/04/2019 Today's weight: 191 lbs Today's date: 02/04/2020 Total lbs lost to date: 17 lbs Total lbs lost since last in-office visit: 0 lbs Total weight loss percentage to date: -8.17%  Interim History: [name] is shocked she didn't gain over the holidays, but she is happy with that.  Assessment/Plan:   Meds ordered this encounter  Medications  . topiramate (TOPAMAX) 50 MG tablet    Sig: Take 1 tablet (50 mg total) by mouth 2 (two) times daily.    Dispense:  60 tablet    Refill:  0  . Vitamin D, Ergocalciferol, (DRISDOL) 1.25 MG (50000 UNIT) CAPS capsule    Sig: Take one tablet wkly    Dispense:  4 capsule    Refill:  0    1. Type 2 diabetes mellitus with other specified complication, without long-term current use of insulin [facility]) [name] doesn't check her blood sugar at home and has no way to do so.  Medications reviewed. Diabetic ROS: no polyuria or polydipsia, no chest pain, dyspnea or TIA's, no numbness, tingling or pain in extremities.   Lab Results  Component Value Date   HGBA1C 6.6 (H) 12/18/2019   HGBA1C 6.3 (A) 08/15/2019   Lab Results  Component Value Date   LDLCALC 125 (H) 08/15/2019   CREATININE 0.79 08/15/2019   Lab Results  Component Value Date   INSULIN 15.1 12/18/2019   INSULIN 71.0 (H) 09/04/2019   Plan: Elliett declines additional medication but we can consider GLP-1 in the future. He is having emotional eating and carb cravings. Refill Topamax BID but increase to 50 mg, up from 25 mg BID.  Refill- topiramate (TOPAMAX) 50 MG tablet; Take 1 tablet (50 mg total) by mouth 2  (two) times daily.  Dispense: 60 tablet; Refill: 0   2. Other constipation Joellen's constipation has improved on Miralax and Senekot, which was given at her last OV. She knows she needs to drink more water.  Plan: Christon was informed that a decrease in bowel movement frequency is normal while losing weight, but stools should not be hard or painful.  Counseling Getting to Good Bowel Health: Your goal is to have one soft bowel movement each day. Drink at least 8 glasses of water each day. Eat plenty of fiber (goal is over 25 grams each day). It is best to get most of your fiber from dietary sources which includes leafy green vegetables, fresh fruit, and whole grains. You may need to add fiber with the help of OTC fiber supplements. These include Metamucil, Citrucel, and Flaxseed. If you are still having trouble, try adding Miralax or Magnesium Citrate. If all of these changes do not work, Cabin crew.   3. Vitamin D deficiency [name]'s Vitamin D level was 55.3 on 12/18/2019. She is currently taking prescription vitamin D 50,000 IU each week. She denies nausea, vomiting or muscle weakness.  Ref. Range 12/18/2019 10:08  Vitamin D, 25-Hydroxy Latest Ref Range: 30.0 - 100.0 ng/mL 55.3   Plan: Refill Vit D for 1 month, as per below. Low Vitamin D level contributes to fatigue and are  associated with obesity, breast, and colon cancer. She agrees to continue to take prescription Vitamin D @50 ,000 IU every week and will follow-up for routine testing of Vitamin D, at least 2-3 times per year to avoid over-replacement.  Refill- Vitamin D, Ergocalciferol, (DRISDOL) 1.25 MG (50000 UNIT) CAPS capsule; Take one tablet wkly  Dispense: 4 capsule; Refill: 0   4. At risk for impaired metabolic function [name] was given approximately 10 minutes of impaired  metabolic function prevention counseling today. We discussed intensive lifestyle modifications today with an emphasis on specific nutrition and exercise  instructions and strategies.    5. Class 2 severe obesity with serious comorbidity and body mass index (BMI) of 35.0 to 35.9 in adult, unspecified obesity type [facility]) [name] is currently in the action stage of change. As such, her goal is to continue with weight loss efforts. She has agreed to the Category 3 Plan.   Exercise goals: As is  Behavioral modification strategies: meal planning and cooking strategies, keeping healthy foods in the home and planning for success.  Laurie has agreed to follow-up with our clinic in 2 weeks. She was informed of the importance of frequent follow-up visits to maximize her success with intensive lifestyle modifications for her multiple health conditions.   Objective:   Blood pressure 99/65, pulse 65, temperature 98.1 F (36.7 C), height 5\' 2"  (1.575 m), weight 191 lb (86.6 kg). Body mass index is 34.93 kg/m.  General: Cooperative, alert, well developed, in no acute distress. HEENT: Conjunctivae and lids unremarkable. Cardiovascular: Regular rhythm.  Lungs: Normal work of breathing. Neurologic: No focal deficits.   Lab Results  Component Value Date   CREATININE 0.79 08/15/2019   BUN 14 08/15/2019   NA 140 08/15/2019   K 4.7 08/15/2019   CL 105 08/15/2019   CO2 18 (L) 08/15/2019   Lab Results  Component Value Date   ALT 15 09/04/2019   AST 15 09/04/2019   ALKPHOS 133 (H) 09/04/2019   BILITOT 0.3 09/04/2019   Lab Results  Component Value Date   HGBA1C 6.6 (H) 12/18/2019   HGBA1C 6.3 (A) 08/15/2019   Lab Results  Component Value Date   INSULIN 15.1 12/18/2019   INSULIN 71.0 (H) 09/04/2019   Lab Results  Component Value Date   TSH 1.670 09/04/2019   Lab Results  Component Value Date   CHOL 193 08/15/2019   HDL 37 (L) 08/15/2019   LDLCALC 125 (H) 08/15/2019   TRIG 172 (H) 08/15/2019   CHOLHDL 5.2 (H) 08/15/2019   Lab Results  Component Value Date   WBC 8.8 09/04/2019   HGB 12.8 09/04/2019   HCT 39.6 09/04/2019   MCV 72 (L)  09/04/2019   PLT 322 09/04/2019    Attestation Statements:   Reviewed by clinician on day of visit: allergies, medications, problem list, medical history, surgical history, family history, social history, and previous encounter notes.   Coral Ceo, am acting as Location manager for [employer], DO.  I have reviewed the above documentation for accuracy and completeness, and I agree with the above. Marjory Sneddon, D.O.  The [place] was signed into law in 2016 which includes the topic of electronic health records.  This provides immediate access to information in MyChart.  This includes consultation notes, operative notes, office notes, lab results and pathology reports.  If you have any questions about what you read please let us know at your next visit so we can discuss your concerns and  take corrective action if need be.  We are right here with you.

## 2020-02-18 ENCOUNTER — Other Ambulatory Visit: Payer: Self-pay

## 2020-02-18 ENCOUNTER — Ambulatory Visit (INDEPENDENT_AMBULATORY_CARE_PROVIDER_SITE_OTHER): Payer: No Typology Code available for payment source | Admitting: Family Medicine

## 2020-02-18 ENCOUNTER — Encounter (INDEPENDENT_AMBULATORY_CARE_PROVIDER_SITE_OTHER): Payer: Self-pay | Admitting: Family Medicine

## 2020-02-18 VITALS — BP 103/61 | HR 102 | Temp 97.8°F | Ht 62.0 in | Wt 191.0 lb

## 2020-02-18 DIAGNOSIS — E1169 Type 2 diabetes mellitus with other specified complication: Secondary | ICD-10-CM

## 2020-02-18 DIAGNOSIS — Z9189 Other specified personal risk factors, not elsewhere classified: Secondary | ICD-10-CM | POA: Diagnosis not present

## 2020-02-18 DIAGNOSIS — E559 Vitamin D deficiency, unspecified: Secondary | ICD-10-CM | POA: Diagnosis not present

## 2020-02-18 DIAGNOSIS — Z6835 Body mass index (BMI) 35.0-35.9, adult: Secondary | ICD-10-CM

## 2020-02-19 NOTE — Progress Notes (Signed)
Chief Complaint:   OBESITY Rebecca Olson is here to discuss her progress with her obesity treatment plan along with follow-up of her obesity related diagnoses. Rebecca Olson is on the Category 3 Plan and states she is following her eating plan approximately 50% of the time. Rebecca Olson states she is doing stairs 20 minutes 3 times per week.  Today's visit was #: 11 Starting weight: 208 lbs Starting date: 09/04/2019 Today's weight: 191 lbs Today's date: 02/18/2020 Total lbs lost to date: 17 lbs Total lbs lost since last in-office visit: 0 Total weight loss percentage to date: -8.17%  Interim History: Pt went to a conference in [place]. She ate out and was fed at the conference via buffet. 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 She identifies those things as the key to her success!  Assessment/Plan:   1. Type 2 diabetes mellitus with other specified complication, without long-term current use of insulin (HCC) Pt is not on medication, as diabetes is diet controlled. Diabetic ROS: no polyuria or polydipsia, no chest pain, dyspnea or TIA's, no numbness, tingling or pain in extremities.   Lab Results  Component Value Date   HGBA1C 6.6 (H) 12/18/2019   HGBA1C 6.3 (A) 08/15/2019   Lab Results  Component Value Date   LDLCALC 125 (H) 08/15/2019   CREATININE 0.79 08/15/2019   Lab Results  Component Value Date   INSULIN 15.1 12/18/2019   INSULIN 71.0 (H) 09/04/2019   Plan: GLP-1 vs Metformin meds discussed with pt. She wishes to focus on meal plan at this time and well consider adding med in the future. Good blood sugar control is important to decrease the likelihood of diabetic complications such as nephropathy, neuropathy, limb loss, blindness, coronary artery disease, and death. Intensive lifestyle modification including diet, exercise and weight loss are the first line of treatment for  diabetes.   2. Vitamin D deficiency Rebecca Olson's Vitamin D level was 55.3 on 12/18/2019. She is currently taking prescription vitamin D 50,000 IU each week. She denies nausea, vomiting or muscle weakness.   Ref. Range 12/18/2019 10:08  Vitamin D, 25-Hydroxy Latest Ref Range: 30.0 - 100.0 ng/mL 55.3   Plan: No need for refill today. Low Vitamin D level contributes to fatigue and are associated with obesity, breast, and colon cancer. She agrees to continue to take prescription Vitamin D @50 ,000 IU every week and will follow-up for routine testing of Vitamin D, at least 2-3 times per year to avoid over-replacement.  3. At risk for dehydration Rebecca Olson was given approximately 10 minutes dehydration prevention counseling today. Rebecca Olson is at risk for dehydration due to weight loss and current medication(s). She was encouraged to hydrate and monitor fluid status to avoid dehydration as well as weight loss plateaus.   4. Class 2 severe obesity with serious comorbidity and body mass index (BMI) of 35.0 to 35.9 in adult, unspecified obesity type [facility]) Rebecca Olson is currently in the action stage of change. As such, her goal is to continue with weight loss efforts. She has agreed to the Category 3 Plan.   Exercise goals: For substantial health benefits, adults should do at least 150 minutes (2 hours and 30 minutes) a week of moderate-intensity, or 75 minutes (1 hour and 15 minutes) a week of vigorous-intensity aerobic physical activity, or an equivalent combination of moderate- and vigorous-intensity aerobic activity. Aerobic activity should be performed in  episodes of at least 10 minutes, and preferably, it should be spread throughout the week.  Behavioral modification strategies: increasing lean protein intake, decreasing simple carbohydrates, meal planning and cooking strategies, keeping healthy foods in the home, better snacking choices, avoiding temptations and planning for success.  Rebecca Olson has agreed to follow-up  with our clinic in 2 weeks. She was informed of the importance of frequent follow-up visits to maximize her success with intensive lifestyle modifications for her multiple health conditions.   Objective:   Blood pressure 103/61, pulse (!) 102, temperature 97.8 F (36.6 C), height 5\' 2"  (1.575 m), weight 191 lb (86.6 kg), SpO2 100 %. Body mass index is 34.93 kg/m.  General: Cooperative, alert, well developed, in no acute distress. HEENT: Conjunctivae and lids unremarkable. Cardiovascular: Regular rhythm.  Lungs: Normal work of breathing. Neurologic: No focal deficits.   Lab Results  Component Value Date   CREATININE 0.79 08/15/2019   BUN 14 08/15/2019   NA 140 08/15/2019   K 4.7 08/15/2019   CL 105 08/15/2019   CO2 18 (L) 08/15/2019   Lab Results  Component Value Date   ALT 15 09/04/2019   AST 15 09/04/2019   ALKPHOS 133 (H) 09/04/2019   BILITOT 0.3 09/04/2019   Lab Results  Component Value Date   HGBA1C 6.6 (H) 12/18/2019   HGBA1C 6.3 (A) 08/15/2019   Lab Results  Component Value Date   INSULIN 15.1 12/18/2019   INSULIN 71.0 (H) 09/04/2019   Lab Results  Component Value Date   TSH 1.670 09/04/2019   Lab Results  Component Value Date   CHOL 193 08/15/2019   HDL 37 (L) 08/15/2019   LDLCALC 125 (H) 08/15/2019   TRIG 172 (H) 08/15/2019   CHOLHDL 5.2 (H) 08/15/2019   Lab Results  Component Value Date   WBC 8.8 09/04/2019   HGB 12.8 09/04/2019   HCT 39.6 09/04/2019   MCV 72 (L) 09/04/2019   PLT 322 09/04/2019   No results found for: IRON, TIBC, FERRITIN Attestation Statements:   Reviewed by clinician on day of visit: allergies, medications, problem list, medical history, surgical history, family history, social history, and previous encounter notes.  Coral Ceo, am acting as Location manager for [employer], DO.  I have reviewed the above documentation for accuracy and completeness, and I agree with the above. Marjory Sneddon, D.O.  The  [place] was signed into law in 2016 which includes the topic of electronic health records.  This provides immediate access to information in MyChart.  This includes consultation notes, operative notes, office notes, lab results and pathology reports.  If you have any questions about what you read please let us know at your next visit so we can discuss your concerns and take corrective action if need be.  We are right here with you.

## 2020-03-03 ENCOUNTER — Encounter (INDEPENDENT_AMBULATORY_CARE_PROVIDER_SITE_OTHER): Payer: Self-pay | Admitting: Family Medicine

## 2020-03-03 ENCOUNTER — Other Ambulatory Visit: Payer: Self-pay

## 2020-03-03 ENCOUNTER — Ambulatory Visit (INDEPENDENT_AMBULATORY_CARE_PROVIDER_SITE_OTHER): Payer: No Typology Code available for payment source | Admitting: Family Medicine

## 2020-03-03 VITALS — BP 109/75 | HR 98 | Temp 98.0°F | Ht 62.0 in | Wt 189.0 lb

## 2020-03-03 DIAGNOSIS — Z9189 Other specified personal risk factors, not elsewhere classified: Secondary | ICD-10-CM | POA: Diagnosis not present

## 2020-03-03 DIAGNOSIS — E785 Hyperlipidemia, unspecified: Secondary | ICD-10-CM

## 2020-03-03 DIAGNOSIS — D508 Other iron deficiency anemias: Secondary | ICD-10-CM

## 2020-03-03 DIAGNOSIS — E1169 Type 2 diabetes mellitus with other specified complication: Secondary | ICD-10-CM | POA: Diagnosis not present

## 2020-03-03 DIAGNOSIS — E559 Vitamin D deficiency, unspecified: Secondary | ICD-10-CM

## 2020-03-03 DIAGNOSIS — F39 Unspecified mood [affective] disorder: Secondary | ICD-10-CM

## 2020-03-03 DIAGNOSIS — E669 Obesity, unspecified: Secondary | ICD-10-CM

## 2020-03-03 DIAGNOSIS — Z6834 Body mass index (BMI) 34.0-34.9, adult: Secondary | ICD-10-CM

## 2020-03-03 MED ORDER — FERROUS SULFATE 325 (65 FE) MG PO TABS
325.0000 mg | ORAL_TABLET | Freq: Every day | ORAL | 0 refills | Status: DC
Start: 2020-03-03 — End: 2020-04-16

## 2020-03-03 MED ORDER — VITAMIN D (ERGOCALCIFEROL) 1.25 MG (50000 UNIT) PO CAPS
ORAL_CAPSULE | ORAL | 0 refills | Status: DC
Start: 2020-03-03 — End: 2020-03-31

## 2020-03-03 MED ORDER — TOPIRAMATE 50 MG PO TABS: 50.0000 mg | ORAL_TABLET | Freq: Two times a day (BID) | ORAL | 0 refills | Status: DC

## 2020-03-05 NOTE — Progress Notes (Signed)
Chief Complaint:   OBESITY Rebecca Olson is here to discuss her progress with her obesity treatment plan along with follow-up of her obesity related diagnoses.   Today's visit was #: 12 Starting weight: 208 lbs Starting date: 09/04/2019 Today's weight: 189 lbs Today's date: 03/03/2020 Total lbs lost to date: 19 lbs Body mass index is 34.57 kg/m.  Total weight loss percentage to date: -9.13%  Interim History:  Rebecca Olson is here for a follow up office visit.  she is following the meal plan without concern or issues.  Patient's meal and food recall appears to be accurate and consistent with what is on the plan.  When on plan, her hunger and cravings are well controlled.    Nutrition Plan: Category 3 Plan for 60-70% of the time. Activity: None at this time.  Assessment/Plan:   Orders Placed This Encounter  Procedures  . Comprehensive metabolic panel  . CBC with Differential/Platelet  . Hemoglobin A1c  . Insulin, random  . Lipid panel  . Ambulatory referral to Behavioral Health    Medications Discontinued During This Encounter  Medication Reason  . ferrous sulfate 325 (65 FE) MG tablet Reorder  . topiramate (TOPAMAX) 50 MG tablet Reorder  . Vitamin D, Ergocalciferol, (DRISDOL) 1.25 MG (50000 UNIT) CAPS capsule Reorder     Meds ordered this encounter  Medications  . ferrous sulfate 325 (65 FE) MG tablet    Sig: Take 1 tablet (325 mg total) by mouth daily with breakfast.    Dispense:  30 tablet    Refill:  0  . topiramate (TOPAMAX) 50 MG tablet    Sig: Take 1 tablet (50 mg total) by mouth 2 (two) times daily.    Dispense:  60 tablet    Refill:  0  . Vitamin D, Ergocalciferol, (DRISDOL) 1.25 MG (50000 UNIT) CAPS capsule    Sig: Take one tablet wkly    Dispense:  4 capsule    Refill:  0     1. Type 2 diabetes mellitus with other specified complication, without long-term current use of insulin (HCC) Diabetes Mellitus: Not at goal. Medication: None as she has declined  them in the past.  She does not check her blood sugars.  She has not seen her PCP since early 2021.   Plan:  Will check fasting insulin and A1c at next office visit.  She still declines medicaitons today, but will consider them in the future if needed with labs.  Lab Results  Component Value Date   HGBA1C 6.6 (H) 12/18/2019   HGBA1C 6.3 (A) 08/15/2019   Lab Results  Component Value Date   LDLCALC 125 (H) 08/15/2019   CREATININE 0.79 08/15/2019   - Comprehensive metabolic panel - CBC with Differential/Platelet - Hemoglobin A1c - Insulin, random - Lipid panel  2. Other iron deficiency anemia Rebecca Olson is taking ferrous sulfate 325 mg daily.  Plan:  Will check CBC in the near future and will refill ferrous sulfate today, as per below.  Nutrition: Iron-rich foods include dark leafy greens, red and white meats, eggs, seafood, and beans.  Certain foods and drinks prevent your body from absorbing iron properly. Avoid eating these foods in the same meal as iron-rich foods or with iron supplements. These foods include: coffee, black tea, and red wine; milk, dairy products, and foods that are high in calcium; beans and soybeans; whole grains. Constipation can be a side effect of iron supplementation. Increased water and fiber intake are helpful. Water goal: >  2 liters/day. Fiber goal: > 25 grams/day.  - Refill ferrous sulfate 325 (65 FE) MG tablet; Take 1 tablet (325 mg total) by mouth daily with breakfast.  Dispense: 30 tablet; Refill: 0 - Comprehensive metabolic panel - CBC with Differential/Platelet  3. Vitamin D deficiency At goal. Current vitamin D is 55.3, tested on 12/18/2019. Optimal goal > 50 ng/dL.   Plan: Continue to take prescription Vitamin D @50 ,000 IU every week as prescribed.  Follow-up for routine testing of Vitamin D, at least 2-3 times per year to avoid over-replacement.  Will refill vitamin D today, as per below.  No need to check vitamin D level at this time.  - Refill  Vitamin D, Ergocalciferol, (DRISDOL) 1.25 MG (50000 UNIT) CAPS capsule; Take one tablet wkly  Dispense: 4 capsule; Refill: 0  4. Hyperlipidemia associated with type 2 diabetes mellitus ([place]) Course: Not at goal. Lipid-lowering medications: None.  She has declined them in the past.   Plan: Dietary changes: Increase soluble fiber, decrease simple carbohydrates, decrease saturated fat. Exercise changes: Moderate to vigorous-intensity aerobic activity 150 minutes per week or as tolerated. We will continue to monitor along with PCP/specialists as it pertains to her weight loss journey.  Will check FLP and CMP in the future.  Lab Results  Component Value Date   CHOL 193 08/15/2019   HDL 37 (L) 08/15/2019   LDLCALC 125 (H) 08/15/2019   TRIG 172 (H) 08/15/2019   CHOLHDL 5.2 (H) 08/15/2019   Lab Results  Component Value Date   ALT 15 09/04/2019   AST 15 09/04/2019   ALKPHOS 133 (H) 09/04/2019   BILITOT 0.3 09/04/2019   The 10-year ASCVD risk score Rebecca Bussing DC Jr., et al., 2013) is: 3.7%   Values used to calculate the score:     Age: 45 years     Sex: Female     Is Non-Hispanic African American: Yes     Diabetic: Yes     Tobacco smoker: No     Systolic Blood Pressure: 419 mmHg     Is BP treated: No     HDL Cholesterol: 37 mg/dL     Total Cholesterol: 193 mg/dL  - Comprehensive metabolic panel - CBC with Differential/Platelet - Lipid panel  5. Mood disorder (Rebecca Olson)- emotional eating Medication: Topamax 50 mg twice daily.  Started Topamax on 12/03/2019 due to increased emotional eating and cravings, especially for sweets.  Tolerating well.  Denies increased stress at this time.  Plan: Will refill Topamax, as per below.  Symptoms are stable and well controlled on medication.  Will place referral to [facility] for counseling.  - Ambulatory referral to [facility] - Refill topiramate (TOPAMAX) 50 MG tablet; Take 1 tablet (50 mg total) by mouth 2 (two) times daily.  Dispense: 60  tablet; Refill: 0  6. At risk for depression Rebecca Olson was given approximately 9 minutes of depression prevention counseling today due to their higher than average risk for this condition. The patient has several risk factors for depression such as chronic medical conditions, sleep issues, major life stressors/events, etc., and we discussed these today.  Milyn D Haltiwanger was also counseled on the importance of a healthy work-life balance, a healthy relationship with food, and a good support system.  We discussed various strategies to help cope with these emotions as well.  I recommended counseling, meditation or prayer, healthy eating habits, sleep hygiene, and exercising to help manage these feelings.   7. Class 1 obesity with serious comorbidity  and body mass index (BMI) of 34.0 to 34.9 in adult, unspecified obesity type  Course: Britanny is currently in the action stage of change. As such, her goal is to continue with weight loss efforts.   Nutrition goals: She has agreed to the Category 3 Plan.   Exercise goals: All adults should avoid inactivity. Some physical activity is better than none, and adults who participate in any amount of physical activity gain some health benefits.  Behavioral modification strategies: increasing lean protein intake, decreasing simple carbohydrates, meal planning and cooking strategies, keeping healthy foods in the home and planning for success.  Chiana has agreed to follow-up with our clinic in 2-3 weeks, fasting for labs. She was informed of the importance of frequent follow-up visits to maximize her success with intensive lifestyle modifications for her multiple health conditions.   Londan was informed we would discuss her lab results at her next visit unless there is a critical issue that needs to be addressed sooner. Sherise agreed to keep her next visit at the agreed upon time to discuss these results.  Objective:   Blood pressure 109/75, pulse 98, temperature 98  F (36.7 C), height 5\' 2"  (1.575 m), weight 189 lb (85.7 kg), SpO2 98 %. Body mass index is 34.57 kg/m.  General: Cooperative, alert, well developed, in no acute distress. HEENT: Conjunctivae and lids unremarkable. Cardiovascular: Regular rhythm.  Lungs: Normal work of breathing. Neurologic: No focal deficits.   Lab Results  Component Value Date   CREATININE 0.79 08/15/2019   BUN 14 08/15/2019   NA 140 08/15/2019   K 4.7 08/15/2019   CL 105 08/15/2019   CO2 18 (L) 08/15/2019   Lab Results  Component Value Date   ALT 15 09/04/2019   AST 15 09/04/2019   ALKPHOS 133 (H) 09/04/2019   BILITOT 0.3 09/04/2019   Lab Results  Component Value Date   HGBA1C 6.6 (H) 12/18/2019   HGBA1C 6.3 (A) 08/15/2019   Lab Results  Component Value Date   INSULIN 15.1 12/18/2019   INSULIN 71.0 (H) 09/04/2019   Lab Results  Component Value Date   TSH 1.670 09/04/2019   Lab Results  Component Value Date   CHOL 193 08/15/2019   HDL 37 (L) 08/15/2019   LDLCALC 125 (H) 08/15/2019   TRIG 172 (H) 08/15/2019   CHOLHDL 5.2 (H) 08/15/2019   Lab Results  Component Value Date   WBC 8.8 09/04/2019   HGB 12.8 09/04/2019   HCT 39.6 09/04/2019   MCV 72 (L) 09/04/2019   PLT 322 09/04/2019   Attestation Statements:   Reviewed by clinician on day of visit: allergies, medications, problem list, medical history, surgical history, family history, social history, and previous encounter notes.  I, Water quality scientist, CMA, am acting as Location manager for [employer], DO.  I have reviewed the above documentation for accuracy and completeness, and I agree with the above. Marjory Sneddon, D.O.  The [place] was signed into law in 2016 which includes the topic of electronic health records.  This provides immediate access to information in MyChart.  This includes consultation notes, operative notes, office notes, lab results and pathology reports.  If you have any questions about what you  read please let us know at your next visit so we can discuss your concerns and take corrective action if need be.  We are right here with you.

## 2020-03-17 ENCOUNTER — Ambulatory Visit (INDEPENDENT_AMBULATORY_CARE_PROVIDER_SITE_OTHER): Payer: No Typology Code available for payment source | Admitting: Family Medicine

## 2020-03-17 ENCOUNTER — Encounter (INDEPENDENT_AMBULATORY_CARE_PROVIDER_SITE_OTHER): Payer: Self-pay | Admitting: Family Medicine

## 2020-03-17 ENCOUNTER — Other Ambulatory Visit: Payer: Self-pay

## 2020-03-17 VITALS — BP 108/74 | HR 73 | Temp 98.0°F | Ht 62.0 in | Wt 190.0 lb

## 2020-03-17 DIAGNOSIS — F39 Unspecified mood [affective] disorder: Secondary | ICD-10-CM

## 2020-03-17 DIAGNOSIS — E785 Hyperlipidemia, unspecified: Secondary | ICD-10-CM

## 2020-03-17 DIAGNOSIS — Z9189 Other specified personal risk factors, not elsewhere classified: Secondary | ICD-10-CM

## 2020-03-17 DIAGNOSIS — E559 Vitamin D deficiency, unspecified: Secondary | ICD-10-CM | POA: Diagnosis not present

## 2020-03-17 DIAGNOSIS — Z6834 Body mass index (BMI) 34.0-34.9, adult: Secondary | ICD-10-CM

## 2020-03-17 DIAGNOSIS — E669 Obesity, unspecified: Secondary | ICD-10-CM

## 2020-03-17 DIAGNOSIS — E1169 Type 2 diabetes mellitus with other specified complication: Secondary | ICD-10-CM

## 2020-03-17 DIAGNOSIS — D508 Other iron deficiency anemias: Secondary | ICD-10-CM

## 2020-03-18 LAB — COMPREHENSIVE METABOLIC PANEL
ALT: 20 IU/L (ref 0–32)
AST: 19 IU/L (ref 0–40)
Albumin/Globulin Ratio: 1.5 (ref 1.2–2.2)
Albumin: 4.1 g/dL (ref 3.8–4.8)
Alkaline Phosphatase: 110 IU/L (ref 44–121)
BUN/Creatinine Ratio: 15 (ref 9–23)
BUN: 14 mg/dL (ref 6–24)
Bilirubin Total: 0.3 mg/dL (ref 0.0–1.2)
CO2: 17 mmol/L — ABNORMAL LOW (ref 20–29)
Calcium: 9.1 mg/dL (ref 8.7–10.2)
Chloride: 108 mmol/L — ABNORMAL HIGH (ref 96–106)
Creatinine, Ser: 0.92 mg/dL (ref 0.57–1.00)
GFR calc Af Amer: 85 mL/min/{1.73_m2} (ref >59)
GFR calc non Af Amer: 73 mL/min/{1.73_m2} (ref >59)
Globulin, Total: 2.7 g/dL (ref 1.5–4.5)
Glucose: 94 mg/dL (ref 65–99)
Potassium: 4.6 mmol/L (ref 3.5–5.2)
Sodium: 141 mmol/L (ref 134–144)
Total Protein: 6.8 g/dL (ref 6.0–8.5)

## 2020-03-18 LAB — LIPID PANEL
Chol/HDL Ratio: 4.8 ratio — ABNORMAL HIGH (ref 0.0–4.4)
Cholesterol, Total: 208 mg/dL — ABNORMAL HIGH (ref 100–199)
HDL: 43 mg/dL (ref >39)
LDL Chol Calc (NIH): 148 mg/dL — ABNORMAL HIGH (ref 0–99)
Triglycerides: 92 mg/dL (ref 0–149)
VLDL Cholesterol Cal: 17 mg/dL (ref 5–40)

## 2020-03-18 LAB — CBC WITH DIFFERENTIAL/PLATELET
Basophils Absolute: 0 10*3/uL (ref 0.0–0.2)
Basos: 1 %
EOS (ABSOLUTE): 0 10*3/uL (ref 0.0–0.4)
Eos: 0 %
Hematocrit: 38.2 % (ref 34.0–46.6)
Hemoglobin: 12.1 g/dL (ref 11.1–15.9)
Immature Grans (Abs): 0 10*3/uL (ref 0.0–0.1)
Immature Granulocytes: 0 %
Lymphocytes Absolute: 3.6 10*3/uL — ABNORMAL HIGH (ref 0.7–3.1)
Lymphs: 51 %
MCH: 23.2 pg — ABNORMAL LOW (ref 26.6–33.0)
MCHC: 31.7 g/dL (ref 31.5–35.7)
MCV: 73 fL — ABNORMAL LOW (ref 79–97)
Monocytes Absolute: 0.4 10*3/uL (ref 0.1–0.9)
Monocytes: 5 %
Neutrophils Absolute: 3 10*3/uL (ref 1.4–7.0)
Neutrophils: 43 %
Platelets: 309 10*3/uL (ref 150–450)
RBC: 5.22 x10E6/uL (ref 3.77–5.28)
RDW: 17.9 % — ABNORMAL HIGH (ref 11.7–15.4)
WBC: 7 10*3/uL (ref 3.4–10.8)

## 2020-03-18 LAB — INSULIN, RANDOM: INSULIN: 10.2 u[IU]/mL (ref 2.6–24.9)

## 2020-03-18 LAB — VITAMIN D 25 HYDROXY (VIT D DEFICIENCY, FRACTURES): Vit D, 25-Hydroxy: 58.1 ng/mL (ref 30.0–100.0)

## 2020-03-18 LAB — HEMOGLOBIN A1C
Est. average glucose Bld gHb Est-mCnc: 128 mg/dL
Hgb A1c MFr Bld: 6.1 % — ABNORMAL HIGH (ref 4.8–5.6)

## 2020-03-25 NOTE — Progress Notes (Signed)
Chief Complaint:   OBESITY Rebecca Olson is here to discuss her progress with her obesity treatment plan along with follow-up of her obesity related diagnoses.   Today's visit was #: 13 Starting weight: 208 lbs Starting date: 09/04/2019 Today's weight: 190 lbs Today's date: 03/17/2020 Total lbs lost to date: 18 lbs Body mass index is 34.75 kg/m.  Total weight loss percentage to date: -8.65%  Interim History:  Rebecca Olson says that this past Saturday was her daughter's birthday.  She skipped breakfast and lunch and then made "terrible choices" later in the day.  She was very upset with herself.  She tells me she knows better.  She denies emotional or financial barriers.    Current Meal Plan: the Category 3 Plan for 60% of the time.  Current Exercise Plan: Walking/YouTube video for 30 minutes 3 times per week.  Assessment/Plan:    1. Type 2 diabetes mellitus with other specified complication, without long-term current use of insulin (HCC) Diabetes Mellitus: Not at goal. Medication: None. Issues reviewed: blood sugar goals, complications of diabetes mellitus, hypoglycemia prevention and treatment, exercise, and nutrition.  She is not checking blood sugars at home.  No symptoms or concerns.  A1c ws 6.6.  Plan: The importance of regular follow up with PCP and all other specialists as scheduled was stressed to patient today.  No medication (declined in past) and not on ACE or ARB.  Encouraged her to speak with her PCP about low dose to protect kidneys.  Will check A1c, CMP, and insulin level today.  Lab Results  Component Value Date   HGBA1C 6.6 (H) 12/18/2019   HGBA1C 6.3 (A) 08/15/2019   2. Hyperlipidemia associated with type 2 diabetes mellitus ([place]) Course: Not at goal. Lipid-lowering medications: None.  She is trying to avoid saturated and trans fats.  She has declined medications in the past, and doesn't want to start them.  Plan: Discussed with pt that all diabetics should be on statin  and I recommend she d/c PCP.  Dietary changes: Increase soluble fiber, decrease simple carbohydrates, decrease saturated fat. Exercise changes: Moderate to vigorous-intensity aerobic activity 150 minutes per week or as tolerated. We will continue to monitor along with PCP/specialists as it pertains to her weight loss journey.  Will check FLP today.  Continue prudent nutritional plan, exercise, and weight loss.  Lab Results  Component Value Date   CHOL 208 (H) 03/17/2020   HDL 43 03/17/2020   LDLCALC 148 (H) 03/17/2020   TRIG 92 03/17/2020   CHOLHDL 4.8 (H) 03/17/2020   Lab Results  Component Value Date   ALT 20 03/17/2020   AST 19 03/17/2020   ALKPHOS 110 03/17/2020   BILITOT 0.3 03/17/2020   The 10-year ASCVD risk score Rebecca Bussing DC Jr., et al., 2013) is: 3.2%   Values used to calculate the score:     Age: 10 years     Sex: Female     Is Non-Hispanic African American: Yes     Diabetic: Yes     Tobacco smoker: No     Systolic Blood Pressure: 010 mmHg     Is BP treated: No     HDL Cholesterol: 43 mg/dL     Total Cholesterol: 208 mg/dL  3. Vitamin D deficiency At goal. Current vitamin D is 55.3, tested on 12/18/2019. Optimal goal > 50 ng/dL.  She is taking vitamin D 50,000 IU weekly.  Plan: Continue to take prescription Vitamin D @50 ,000 IU every week as prescribed.  No need for refill today.  Will check vitamin D level today.  4. Other iron deficiency anemia Rebecca Olson is not taking an iron supplement.  She stopped taking it on her own.  She denies increased fatigue or concerns.  Plan:  Continue ferrous sulfate supplement.  Will check CBC today.  Nutrition: Iron-rich foods include dark leafy greens, red and white meats, eggs, seafood, and beans.  Certain foods and drinks prevent your body from absorbing iron properly. Avoid eating these foods in the same meal as iron-rich foods or with iron supplements. These foods include: coffee, black tea, and red wine; milk, dairy products, and foods  that are high in calcium; beans and soybeans; whole grains. Constipation can be a side effect of iron supplementation. Increased water and fiber intake are helpful. Water goal: > 2 liters/day. Fiber goal: > 25 grams/day.  5. Mood disorder ([name])- emotional eating Controlled. Medication:  Topamax 50 mg twice daily.  She endorses cravings.  Topamax helps a lot, she says.  No side effects or concerns with medication.  Denies depression.  Plan:  Continue Topamax.  Declines refill.  Will check labs today.  Behavior modification techniques were discussed today to help deal with emotional/non-hunger eating behaviors.  6. At risk for deficient knowledge of diabetes mellitus [name] was given approximately 8 minutes of diabetes education and counseling today. We discussed intensive lifestyle modifications today with an emphasis on weight loss as well as increasing exercise and decreasing simple carbohydrates in her diet. We also reviewed medication options with an emphasis on risk versus benefit of those discussed.   7. Class 1 obesity with serious comorbidity and body mass index (BMI) of 34.0 to 34.9 in adult, unspecified obesity type  Course: Rebecca Olson is currently in the action stage of change. As such, her goal is to continue with weight loss efforts.   Nutrition goals: She has agreed to the Category 3 Plan.   Exercise goals: For substantial health benefits, adults should do at least 150 minutes (2 hours and 30 minutes) a week of moderate-intensity, or 75 minutes (1 hour and 15 minutes) a week of vigorous-intensity aerobic physical activity, or an equivalent combination of moderate- and vigorous-intensity aerobic activity. Aerobic activity should be performed in episodes of at least 10 minutes, and preferably, it should be spread throughout the week.  Behavioral modification strategies: increasing lean protein intake, decreasing simple carbohydrates, meal planning and cooking strategies, keeping healthy foods  in the home, emotional eating strategies and planning for success.  Rebecca Olson has agreed to follow-up with our clinic in 2-3 weeks. She was informed of the importance of frequent follow-up visits to maximize her success with intensive lifestyle modifications for her multiple health conditions.   Daniqua was informed we would discuss her lab results at her next visit unless there is a critical issue that needs to be addressed sooner. Michaelle agreed to keep her next visit at the agreed upon time to discuss these results.  Objective:   Blood pressure 108/74, pulse 73, temperature 98 F (36.7 C), height 5\' 2"  (1.575 m), weight 190 lb (86.2 kg), SpO2 99 %. Body mass index is 34.75 kg/m.  General: Cooperative, alert, well developed, in no acute distress. HEENT: Conjunctivae and lids unremarkable. Cardiovascular: Regular rhythm.  Lungs: Normal work of breathing. Neurologic: No focal deficits.   Lab Results  Component Value Date   CREATININE 0.92 03/17/2020   BUN 14 03/17/2020   NA 141 03/17/2020   K 4.6 03/17/2020   CL 108 (H)  03/17/2020   CO2 17 (L) 03/17/2020   Lab Results  Component Value Date   ALT 20 03/17/2020   AST 19 03/17/2020   ALKPHOS 110 03/17/2020   BILITOT 0.3 03/17/2020   Lab Results  Component Value Date   HGBA1C 6.1 (H) 03/17/2020   HGBA1C 6.6 (H) 12/18/2019   HGBA1C 6.3 (A) 08/15/2019   Lab Results  Component Value Date   INSULIN 10.2 03/17/2020   INSULIN 15.1 12/18/2019   INSULIN 71.0 (H) 09/04/2019   Lab Results  Component Value Date   TSH 1.670 09/04/2019   Lab Results  Component Value Date   CHOL 208 (H) 03/17/2020   HDL 43 03/17/2020   LDLCALC 148 (H) 03/17/2020   TRIG 92 03/17/2020   CHOLHDL 4.8 (H) 03/17/2020   Lab Results  Component Value Date   WBC 7.0 03/17/2020   HGB 12.1 03/17/2020   HCT 38.2 03/17/2020   MCV 73 (L) 03/17/2020   PLT 309 03/17/2020   Attestation Statements:   Reviewed by clinician on day of visit: allergies,  medications, problem list, medical history, surgical history, family history, social history, and previous encounter notes.  I, Water quality scientist, CMA, am acting as Location manager for [employer], DO.  I have reviewed the above documentation for accuracy and completeness, and I agree with the above. Marjory Sneddon, D.O.  The [place] was signed into law in 2016 which includes the topic of electronic health records.  This provides immediate access to information in MyChart.  This includes consultation notes, operative notes, office notes, lab results and pathology reports.  If you have any questions about what you read please let us know at your next visit so we can discuss your concerns and take corrective action if need be.  We are right here with you.

## 2020-03-31 ENCOUNTER — Other Ambulatory Visit: Payer: Self-pay

## 2020-03-31 ENCOUNTER — Ambulatory Visit (INDEPENDENT_AMBULATORY_CARE_PROVIDER_SITE_OTHER): Payer: No Typology Code available for payment source | Admitting: Family Medicine

## 2020-03-31 ENCOUNTER — Encounter (INDEPENDENT_AMBULATORY_CARE_PROVIDER_SITE_OTHER): Payer: Self-pay | Admitting: Family Medicine

## 2020-03-31 VITALS — BP 113/74 | HR 86 | Temp 97.8°F | Ht 62.0 in | Wt 188.0 lb

## 2020-03-31 DIAGNOSIS — Z6834 Body mass index (BMI) 34.0-34.9, adult: Secondary | ICD-10-CM

## 2020-03-31 DIAGNOSIS — F39 Unspecified mood [affective] disorder: Secondary | ICD-10-CM

## 2020-03-31 DIAGNOSIS — E1169 Type 2 diabetes mellitus with other specified complication: Secondary | ICD-10-CM

## 2020-03-31 DIAGNOSIS — E785 Hyperlipidemia, unspecified: Secondary | ICD-10-CM

## 2020-03-31 DIAGNOSIS — Z9189 Other specified personal risk factors, not elsewhere classified: Secondary | ICD-10-CM

## 2020-03-31 DIAGNOSIS — E559 Vitamin D deficiency, unspecified: Secondary | ICD-10-CM | POA: Diagnosis not present

## 2020-03-31 DIAGNOSIS — E611 Iron deficiency: Secondary | ICD-10-CM | POA: Diagnosis not present

## 2020-03-31 DIAGNOSIS — D508 Other iron deficiency anemias: Secondary | ICD-10-CM

## 2020-03-31 DIAGNOSIS — E669 Obesity, unspecified: Secondary | ICD-10-CM

## 2020-03-31 MED ORDER — VITAMIN D (ERGOCALCIFEROL) 1.25 MG (50000 UNIT) PO CAPS: ORAL_CAPSULE | ORAL | 0 refills | Status: DC

## 2020-03-31 MED ORDER — TOPIRAMATE 50 MG PO TABS: 50.0000 mg | ORAL_TABLET | Freq: Two times a day (BID) | ORAL | 0 refills | Status: DC

## 2020-03-31 NOTE — Patient Instructions (Signed)
The 10-year ASCVD risk score [name] DC Brooke Bonito., et al., 2013) is: 3.7%   Values used to calculate the score:     Age: 50 years     Sex: Female     Is Non-Hispanic African American: Yes     Diabetic: Yes     Tobacco smoker: No     Systolic Blood Pressure: 101 mmHg     Is BP treated: No     HDL Cholesterol: 43 mg/dL     Total Cholesterol: 208 mg/dL

## 2020-04-01 DIAGNOSIS — Z9189 Other specified personal risk factors, not elsewhere classified: Secondary | ICD-10-CM | POA: Insufficient documentation

## 2020-04-02 NOTE — Progress Notes (Signed)
Chief Complaint:   OBESITY Rebecca Olson is here to discuss her progress with her obesity treatment plan along with follow-up of her obesity related diagnoses.   Today's visit was #: 14 Starting weight: 208 lbs Starting date: 09/04/2019 Today's weight: 188 lbs Today's date: 03/31/2020 Total lbs lost to date: 20 lbs Body mass index is 34.39 kg/m.  Total weight loss percentage to date: -9.62%  Interim History:  Rebecca Olson says, "I think I can do better".  She is not eating all foods on plan, especially on weekends, and is doing less activity during the week, she says.  She wants to lose more, but is not sure she can actually dedicate more time and effort to it.  Denies issues with hunger or cravings when on plan.  Plan:  Labs were discussed with her in detail and all questions were answered.  Current Meal Plan: the Category 3 Plan for 60% of the time.  Current Exercise Plan: Walking for 30 minutes 2 times per week.  Assessment/Plan:   No orders of the defined types were placed in this encounter.   Medications Discontinued During This Encounter  Medication Reason  . topiramate (TOPAMAX) 50 MG tablet Reorder  . Vitamin D, Ergocalciferol, (DRISDOL) 1.25 MG (50000 UNIT) CAPS capsule Reorder  . ferrous sulfate 325 (65 FE) MG tablet Reorder     Meds ordered this encounter  Medications  . topiramate (TOPAMAX) 50 MG tablet    Sig: Take 1 tablet (50 mg total) by mouth 2 (two) times daily.    Dispense:  60 tablet    Refill:  0  . Vitamin D, Ergocalciferol, (DRISDOL) 1.25 MG (50000 UNIT) CAPS capsule    Sig: Take one tablet wkly    Dispense:  4 capsule    Refill:  0  . ferrous sulfate 325 (65 FE) MG tablet    Sig: Take 1 tablet (325 mg total) by mouth daily with breakfast.    Dispense:  30 tablet    Refill:  0     1. Vitamin D deficiency At goal. Current vitamin D is 58.1, tested on 03/17/2020. Optimal goal > 50 ng/dL.  She is taking vitamin D 50,000 IU weekly.  Plan:  Discussed labs  with patient today.  Continue to take prescription Vitamin D @50 ,000 IU every week as prescribed.  Follow-up for routine testing of Vitamin D, at least 2-3 times per year to avoid over-replacement.  - Refill Vitamin D, Ergocalciferol, (DRISDOL) 1.25 MG (50000 UNIT) CAPS capsule; Take one tablet wkly  Dispense: 4 capsule; Refill: 0  2. Type 2 diabetes mellitus with other specified complication, without long-term current use of insulin (HCC) Diabetes Mellitus: Not at goal. Medication: None. Issues reviewed: blood sugar goals, complications of diabetes mellitus, hypoglycemia prevention and treatment, exercise, and nutrition.  She declines starting any medication.  A1c was 6.6 3 months ago and is 6.1 now.  FI is lower than prior as well.  Plan:  Discussed labs with patient today.  A1c is at goal.  Continue prudent nutritional plan, weight loss, decrease simple carbohydrates.  The importance of regular follow up with PCP and all other specialists as scheduled was stressed to patient today.  Lab Results  Component Value Date   HGBA1C 6.1 (H) 03/17/2020   HGBA1C 6.6 (H) 12/18/2019   HGBA1C 6.3 (A) 08/15/2019   Lab Results  Component Value Date   LDLCALC 148 (H) 03/17/2020   CREATININE 0.92 03/17/2020   3. Hyperlipidemia associated with type 2  diabetes mellitus ([place]) Course: Uncontrolled.  Lipid-lowering medications: None.    Plan:  Discussed labs with patient today.  Dietary changes: Increase soluble fiber, decrease simple carbohydrates, decrease saturated fat. Exercise changes: Moderate to vigorous-intensity aerobic activity 150 minutes per week or as tolerated. We will continue to monitor along with PCP/specialists as it pertains to her weight loss journey.  -  Discussed that the standard of care with her having diabetes would be for her to start a statin.  She does not like medication and declines today.  She will follow up with her primary care for further management of this - Continue prudent  nutritional plan and exercise.  Lab Results  Component Value Date   CHOL 208 (H) 03/17/2020   HDL 43 03/17/2020   LDLCALC 148 (H) 03/17/2020   TRIG 92 03/17/2020   CHOLHDL 4.8 (H) 03/17/2020   Lab Results  Component Value Date   ALT 20 03/17/2020   AST 19 03/17/2020   ALKPHOS 110 03/17/2020   BILITOT 0.3 03/17/2020    4. Iron deficiency Rebecca Olson is taking ferrous sulfate 325 mg daily.  She says that it bothers her to take her iron supplement on an empty stomach.  Can cause some constipation as well.  She takes it sporadically.  Plan:  Discussed labs with patient today.   - Start consistently taking iron 325 mg daily.  Constipation prevention discussed today.  Senokot and MiraLAX as needed.  Increase fiber and water intake.  Nutrition: Iron-rich foods include dark leafy greens, red and white meats, eggs, seafood, and beans.  Certain foods and drinks prevent your body from absorbing iron properly. Avoid eating these foods in the same meal as iron-rich foods or with iron supplements. These foods include: coffee, black tea, and red wine; milk, dairy products, and foods that are high in calcium; beans and soybeans; whole grains. Constipation can be a side effect of iron supplementation. Increased water and fiber intake are helpful. Water goal: > 2 liters/day. Fiber goal: > 25 grams/day.  CBC Latest Ref Rng & Units 03/17/2020 09/04/2019 01/03/2019  WBC 3.4 - 10.8 x10E3/uL 7.0 8.8 11.6(H)  Hemoglobin 11.1 - 15.9 g/dL 12.1 12.8 12.1  Hematocrit 34.0 - 46.6 % 38.2 39.6 35.9  Platelets 150 - 450 x10E3/uL 309 322 379   Lab Results  Component Value Date   VITAMINB12 615 09/04/2019   5. Mood disorder, with emotional eating Controlled. Medication: Topamax 50 mg twice daily.  No side effects or concerns.  Well controlled on current regimen.  Plan:  Discussed labs with patient today.  Will refill Topamax today, as per below.  Behavior modification techniques were discussed today to help deal with  emotional/non-hunger eating behaviors.  - Refill topiramate (TOPAMAX) 50 MG tablet; Take 1 tablet (50 mg total) by mouth 2 (two) times daily.  Dispense: 60 tablet; Refill: 0  6. At risk for heart disease Due to Rebecca Olson's current state of health and medical condition(s), she is at a higher risk for heart disease.  This puts the patient at much greater risk to subsequently develop cardiopulmonary conditions that can significantly affect patient's quality of life in a negative manner.    At least 23 minutes were spent on counseling Rebecca Olson about these concerns today, and I stressed the importance of reversing risks factors of obesity, especially truncal and visceral fat, hypertension, hyperlipidemia, and pre-diabetes.  Also explained that being diabetic also increases her risk of heart disease significantly.  The initial goal is to lose at least  5-10% of starting weight to help reduce these risk factors.  Counseling:  Intensive lifestyle modifications were discussed with Rebecca Olson as the most appropriate first line of treatment.  she will continue to work on diet, exercise, and weight loss efforts.  We will continue to reassess these conditions on a fairly regular basis in an attempt to decrease the patient's overall morbidity and mortality.  Evidence-based interventions for health behavior change were utilized today including the discussion of self monitoring techniques, problem-solving barriers, and SMART goal setting techniques.  Specifically, regarding patient's less desirable eating habits and patterns, we employed the technique of small changes when Rebecca Olson has not been able to fully commit to her prudent nutritional plan.  7. Class 1 obesity with serious comorbidity and body mass index (BMI) of 34.0 to 34.9 in adult, unspecified obesity type  Course: Rebecca Olson is currently in the action stage of change. As such, her goal is to continue with weight loss efforts.   Nutrition goals: She has agreed to the Category  3 Plan.   Exercise goals: As is, but increase frequency as tolerated.  Behavioral modification strategies: keeping healthy foods in the home, better snacking choices, avoiding temptations and planning for success.  Rebecca Olson has agreed to follow-up with our clinic in 2-3 weeks. She was informed of the importance of frequent follow-up visits to maximize her success with intensive lifestyle modifications for her multiple health conditions.   Objective:   Blood pressure 113/74, pulse 86, temperature 97.8 F (36.6 C), height 5\' 2"  (1.575 m), weight 188 lb (85.3 kg), SpO2 99 %. Body mass index is 34.39 kg/m.  General: Cooperative, alert, well developed, in no acute distress. HEENT: Conjunctivae and lids unremarkable. Cardiovascular: Regular rhythm.  Lungs: Normal work of breathing. Neurologic: No focal deficits.   Lab Results  Component Value Date   CREATININE 0.92 03/17/2020   BUN 14 03/17/2020   NA 141 03/17/2020   K 4.6 03/17/2020   CL 108 (H) 03/17/2020   CO2 17 (L) 03/17/2020   Lab Results  Component Value Date   ALT 20 03/17/2020   AST 19 03/17/2020   ALKPHOS 110 03/17/2020   BILITOT 0.3 03/17/2020   Lab Results  Component Value Date   HGBA1C 6.1 (H) 03/17/2020   HGBA1C 6.6 (H) 12/18/2019   HGBA1C 6.3 (A) 08/15/2019   Lab Results  Component Value Date   INSULIN 10.2 03/17/2020   INSULIN 15.1 12/18/2019   INSULIN 71.0 (H) 09/04/2019   Lab Results  Component Value Date   TSH 1.670 09/04/2019   Lab Results  Component Value Date   CHOL 208 (H) 03/17/2020   HDL 43 03/17/2020   LDLCALC 148 (H) 03/17/2020   TRIG 92 03/17/2020   CHOLHDL 4.8 (H) 03/17/2020   Lab Results  Component Value Date   WBC 7.0 03/17/2020   HGB 12.1 03/17/2020   HCT 38.2 03/17/2020   MCV 73 (L) 03/17/2020   PLT 309 03/17/2020   Attestation Statements:   Reviewed by clinician on day of visit: allergies, medications, problem list, medical history, surgical history, family history,  social history, and previous encounter notes.  I, Water quality scientist, CMA, am acting as Location manager for [employer], DO.  I have reviewed the above documentation for accuracy and completeness, and I agree with the above. Rebecca Olson, D.O.  The [place] was signed into law in 2016 which includes the topic of electronic health records.  This provides immediate access to information in MyChart.  This includes consultation notes, operative notes, office notes, lab results and pathology reports.  If you have any questions about what you read please let us know at your next visit so we can discuss your concerns and take corrective action if need be.  We are right here with you.

## 2020-04-06 ENCOUNTER — Other Ambulatory Visit (INDEPENDENT_AMBULATORY_CARE_PROVIDER_SITE_OTHER): Payer: Self-pay | Admitting: Family Medicine

## 2020-04-06 DIAGNOSIS — F39 Unspecified mood [affective] disorder: Secondary | ICD-10-CM

## 2020-04-16 MED ORDER — FERROUS SULFATE 325 (65 FE) MG PO TABS: 325.0000 mg | ORAL_TABLET | Freq: Every day | ORAL | 0 refills | Status: DC

## 2020-04-17 ENCOUNTER — Ambulatory Visit (INDEPENDENT_AMBULATORY_CARE_PROVIDER_SITE_OTHER): Payer: No Typology Code available for payment source | Admitting: Family Medicine

## 2020-04-24 ENCOUNTER — Ambulatory Visit (INDEPENDENT_AMBULATORY_CARE_PROVIDER_SITE_OTHER): Payer: No Typology Code available for payment source | Admitting: Family Medicine

## 2020-04-25 ENCOUNTER — Other Ambulatory Visit (INDEPENDENT_AMBULATORY_CARE_PROVIDER_SITE_OTHER): Payer: Self-pay | Admitting: Family Medicine

## 2020-04-25 DIAGNOSIS — D508 Other iron deficiency anemias: Secondary | ICD-10-CM

## 2020-04-29 ENCOUNTER — Telehealth: Payer: No Typology Code available for payment source | Admitting: Family Medicine

## 2020-04-29 ENCOUNTER — Other Ambulatory Visit: Payer: Self-pay

## 2020-05-13 ENCOUNTER — Encounter: Payer: Self-pay | Admitting: Family Medicine

## 2020-05-13 ENCOUNTER — Other Ambulatory Visit: Payer: Self-pay

## 2020-05-13 ENCOUNTER — Ambulatory Visit (INDEPENDENT_AMBULATORY_CARE_PROVIDER_SITE_OTHER): Payer: No Typology Code available for payment source | Admitting: Family Medicine

## 2020-05-13 VITALS — BP 128/80 | HR 86 | Wt 194.6 lb

## 2020-05-13 DIAGNOSIS — R7303 Prediabetes: Secondary | ICD-10-CM

## 2020-05-13 DIAGNOSIS — F39 Unspecified mood [affective] disorder: Secondary | ICD-10-CM

## 2020-05-13 DIAGNOSIS — Z1231 Encounter for screening mammogram for malignant neoplasm of breast: Secondary | ICD-10-CM

## 2020-05-13 MED ORDER — TOPIRAMATE 50 MG PO TABS: 50.0000 mg | ORAL_TABLET | Freq: Two times a day (BID) | ORAL | 0 refills | Status: DC

## 2020-05-13 NOTE — Patient Instructions (Signed)
Thank you for coming to see me today. It was a pleasure.    Follow up with Dr. Raliegh Scarlet for weight loss management  Please follow-up with PCP for PAP smear in June  If you have any questions or concerns, please do not hesitate to call the office at [phone].  Best,   Carollee Leitz, MD

## 2020-05-13 NOTE — Progress Notes (Signed)
    SUBJECTIVE:   CHIEF COMPLAINT / HPI: discuss A1c  Had blood work ordered by provider at Weight loss clinic in 02/22. A1c 6.1.  Asymptomatic.Concerned that she has diabetes requiring treatment.  Has lost 9% body weight since staring program with diet,exercise and Topiramate.  Has run out mediation 1week ago.  PERTINENT  PMH / PSH:  Elevated BMI Prediabetes  OBJECTIVE:   BP 128/80   Pulse 86   Wt 194 lb 9.6 oz (88.3 kg)   SpO2 96%   BMI 35.59 kg/m    General: Alert, no acute distress Cardio: Normal S1 and S2, RRR, no r/m/g Pulm: CTAB, normal work of breathing  ASSESSMENT/PLAN:   Prediabetes A1c 6.1.  Improved from previous check.  -Continue current routine -Consider Bariatric surgery -Repeat 6/52 -Topamax refill -Continue Weight loss management clinic -Follow up with PCP 6/12     Carollee Leitz, MD [place]

## 2020-05-17 ENCOUNTER — Encounter: Payer: Self-pay | Admitting: Family Medicine

## 2020-05-17 NOTE — Assessment & Plan Note (Signed)
A1c 6.1.  Improved from previous check.  -Continue current routine -Consider Bariatric surgery -Repeat 6/52 -Topamax refill -Continue Weight loss management clinic -Follow up with PCP 6/12

## 2020-05-21 ENCOUNTER — Telehealth: Payer: Self-pay

## 2020-05-21 NOTE — Telephone Encounter (Signed)
Pt was NS for PV.  Attempted to reach pt to r/s but unable to leave a message.  NS letter sent and procedure was cancelled.

## 2020-06-04 ENCOUNTER — Encounter: Payer: No Typology Code available for payment source | Admitting: Gastroenterology

## 2020-06-08 ENCOUNTER — Other Ambulatory Visit: Payer: Self-pay | Admitting: Family Medicine

## 2020-06-08 DIAGNOSIS — F39 Unspecified mood [affective] disorder: Secondary | ICD-10-CM

## 2020-06-28 ENCOUNTER — Other Ambulatory Visit: Payer: Self-pay

## 2020-06-28 ENCOUNTER — Ambulatory Visit
Admission: RE | Admit: 2020-06-28 | Discharge: 2020-06-28 | Disposition: A | Discharge status: Home / Self Care | Payer: No Typology Code available for payment source | Source: Ambulatory Visit | Attending: Family Medicine | Admitting: Family Medicine

## 2020-06-28 DIAGNOSIS — Z1231 Encounter for screening mammogram for malignant neoplasm of breast: Secondary | ICD-10-CM

## 2020-07-02 ENCOUNTER — Other Ambulatory Visit: Payer: Self-pay | Admitting: Family Medicine

## 2020-07-02 DIAGNOSIS — F39 Unspecified mood [affective] disorder: Secondary | ICD-10-CM

## 2020-07-17 ENCOUNTER — Other Ambulatory Visit: Payer: Self-pay | Admitting: Family Medicine

## 2020-07-17 DIAGNOSIS — F39 Unspecified mood [affective] disorder: Secondary | ICD-10-CM

## 2020-11-04 ENCOUNTER — Other Ambulatory Visit (HOSPITAL_COMMUNITY)
Admission: RE | Admit: 2020-11-04 | Discharge: 2020-11-04 | Disposition: A | Discharge status: Home / Self Care | Payer: PRIVATE HEALTH INSURANCE | Source: Ambulatory Visit | Attending: Family Medicine | Admitting: Family Medicine

## 2020-11-04 ENCOUNTER — Other Ambulatory Visit: Payer: Self-pay

## 2020-11-04 ENCOUNTER — Ambulatory Visit (INDEPENDENT_AMBULATORY_CARE_PROVIDER_SITE_OTHER): Payer: No Typology Code available for payment source

## 2020-11-04 ENCOUNTER — Encounter: Payer: Self-pay | Admitting: Family Medicine

## 2020-11-04 ENCOUNTER — Ambulatory Visit (INDEPENDENT_AMBULATORY_CARE_PROVIDER_SITE_OTHER): Payer: No Typology Code available for payment source | Admitting: Family Medicine

## 2020-11-04 VITALS — BP 110/52 | HR 71 | Ht 62.0 in | Wt 196.8 lb

## 2020-11-04 DIAGNOSIS — Z23 Encounter for immunization: Secondary | ICD-10-CM

## 2020-11-04 DIAGNOSIS — Z124 Encounter for screening for malignant neoplasm of cervix: Secondary | ICD-10-CM

## 2020-11-04 DIAGNOSIS — D508 Other iron deficiency anemias: Secondary | ICD-10-CM | POA: Diagnosis not present

## 2020-11-04 DIAGNOSIS — N959 Unspecified menopausal and perimenopausal disorder: Secondary | ICD-10-CM

## 2020-11-04 DIAGNOSIS — E559 Vitamin D deficiency, unspecified: Secondary | ICD-10-CM

## 2020-11-04 DIAGNOSIS — Z113 Encounter for screening for infections with a predominantly sexual mode of transmission: Secondary | ICD-10-CM

## 2020-11-04 DIAGNOSIS — R7303 Prediabetes: Secondary | ICD-10-CM

## 2020-11-04 DIAGNOSIS — J3089 Other allergic rhinitis: Secondary | ICD-10-CM

## 2020-11-04 LAB — POCT GLYCOSYLATED HEMOGLOBIN (HGB A1C): HbA1c, POC (prediabetic range): 6.2 % (ref 5.7–6.4)

## 2020-11-04 LAB — POCT URINE PREGNANCY: Preg Test, Ur: NEGATIVE

## 2020-11-04 MED ORDER — LEVOCETIRIZINE DIHYDROCHLORIDE 5 MG PO TABS: ORAL_TABLET | ORAL | 0 refills | Status: DC

## 2020-11-04 MED ORDER — FERROUS SULFATE 325 (65 FE) MG PO TABS: 325.0000 mg | ORAL_TABLET | Freq: Every day | ORAL | 0 refills | Status: DC

## 2020-11-04 NOTE — Patient Instructions (Addendum)
Thank you for coming to see me today. It was a pleasure. Today we talked about:   You should pay attention to your hemoglobin A1C.  It is a three month test about your average blood sugar. If the A1C is - <7.0 is great.  That is our goal for treating you. - Between 7.0 and 9.0 is not so good.  We would need to work to do better. - Above 9.0 is terrible.  You would really need to work with [place] to get it under control.    Today's A1C = 6.2   We will get some labs today.  If they are abnormal or we need to do something about them, I will call you.  If they are normal, I will send you a message on MyChart (if it is active) or a letter in the mail.  If you don't hear from [place] in 2 weeks, please call the office at the number below.   Please follow-up with me as needed  If you have any questions or concerns, please do not hesitate to call the office at [phone].  Best,   Carollee Leitz, MD    Menopause and Hormone Replacement Therapy Menopause is a normal time of life when menstrual periods stop completely and the ovaries stop producing the female hormones estrogen and progesterone. Low levels of these hormones can affect your health and cause symptoms. Hormone replacement therapy (HRT) can relieve some of those symptoms. HRT is the use of artificial (synthetic) hormones to replace hormones that your body has stopped producing because you have reached menopause. Types of HRT HRT may consist of the synthetic hormones estrogen and progestin, or it may consist of estrogen-only therapy. You and your health care provider will decide which form of HRT is best for you. If you choose to be on HRT and you have a uterus, estrogen and progestin are usually prescribed. Estrogen-only therapy is used for women who do not have a uterus. Possible options for taking HRT include: Pills. Patches. Gels. Sprays. Vaginal cream. Vaginal rings. Vaginal inserts. The amount of hormones that you take and how long  you take them varies according to your health. It is important to: Begin HRT with the lowest possible dosage. Stop HRT as soon as your health care provider tells you to stop. Work with your health care provider so that you feel informed and comfortable with your decisions. Tell a health care provider about: Any allergies you have. Whether you have had blood clots or know of any risk factors you may have for blood clots. Whether you or family members have had cancer, especially cancer of the breasts, ovaries, or uterus. Any surgeries you have had. All medicines you are taking, including vitamins, herbs, eye drops, creams, and over-the-counter medicines. Whether you are pregnant or may be pregnant. Any medical conditions you have. What are the benefits? HRT can reduce the frequency and severity of menopausal symptoms. Benefits of HRT vary according to the kind of symptoms that you have, how severe they are, and your overall health. HRT may help to improve the following symptoms of menopause: Hot flashes and night sweats. These are sudden feelings of heat that spread over the face and body. The skin may turn red, like a blush. Night sweats are hot flashes that happen while you are sleeping or trying to sleep. Bone loss (osteoporosis). The body loses calcium more quickly after menopause, causing the bones to become weaker. This can increase the risk for bone  breaks (fractures). Vaginal dryness. The lining of the vagina can become thin and dry, which can cause pain during sex or cause infection, burning, or itching. Urinary tract infections. Urinary incontinence. This is the inability to control when you urinate. Irritability. Short-term memory problems. What are the risks? Risks of HRT vary depending on your individual health and medical history. Risks of HRT also depend on whether you receive both estrogen and progestin or you receive estrogen only. HRT may increase the risk of: Spotting. This is  when a small amount of blood leaks from the vagina unexpectedly. Endometrial cancer. This cancer is in the lining of the uterus (endometrium). Breast cancer. Increased density of breast tissue. This can make it harder to find breast cancer on a breast X-ray (mammogram). Stroke. Heart disease. Blood clots. Gallbladder disease or liver disease. Risks of HRT can increase if you have any of the following conditions: Endometrial cancer. Liver disease. Heart disease. Breast cancer. History of blood clots. History of stroke. Follow these instructions at home: Pap tests Have Pap tests done as often as told by your health care provider. A Pap test is sometimes called a Pap smear. It is a screening test that is used to check for signs of cancer of the cervix and vagina. A Pap test can also identify the presence of infection or precancerous changes. Pap tests may be done: Every 3 years, starting at age 78. Every 5 years, starting after age 46, in combination with testing for human papillomavirus (HPV). More often or less often depending on other medical conditions you have, your age, and other risk factors. It is up to you to get the results of your Pap test. Ask your health care provider, or the department that is doing the test, when your results will be ready. General instructions Take over-the-counter and prescription medicines only as told by your health care provider. Do not use any products that contain nicotine or tobacco. These products include cigarettes, chewing tobacco, and vaping devices, such as e-cigarettes. If you need help quitting, ask your health care provider. Get mammograms, pelvic exams, and medical checkups as often as told by your health care provider. Keep all follow-up visits. This is important. Contact a health care provider if you have: Pain or swelling in your legs. Lumps or changes in your breasts or armpits. Pain, burning, or bleeding when you urinate. Unusual  vaginal bleeding. Dizziness or headaches. Pain in your abdomen. Get help right away if you have: Shortness of breath. Chest pain. Slurred speech. Weakness or numbness in any part of your arms or legs. These symptoms may represent a serious problem that is an emergency. Do not wait to see if the symptoms will go away. Get medical help right away. Call your local emergency services (911 in the [place] Do not drive yourself to the hospital. Summary Menopause is a normal time of life when menstrual periods stop completely and the ovaries stop producing the female hormones estrogen and progesterone. HRT can reduce the frequency and severity of menopausal symptoms. Risks of HRT vary depending on your individual health and medical history. This information is not intended to replace advice given to you by your health care provider. Make sure you discuss any questions you have with your health care provider. Document Revised: 07/16/2019 Document Reviewed: 07/16/2019 Elsevier Patient Education  [place].

## 2020-11-04 NOTE — Progress Notes (Signed)
    SUBJECTIVE:   CHIEF COMPLAINT / HPI:    Gyn concerns/Preventative healthcare Last menstrual period: Patient's last menstrual period was 11/05/2019. Regular periods: no Sexually active: yes, last week no condoms used Contraception or hormonal therapy:no Hx of STD: Patient desire STD screening Dyspareunia: No Hot flashes: yes for this past year Vaginal discharge: none Dysuria:No  Last mammogram: 2022, normal Breast mass or concerns: No Last pap smear: 2019   PERTINENT  PMH / PSH:  AUB Ammenorrhea  OBJECTIVE:   BP (!) 110/52   Pulse 71   Ht 5\' 2"  (1.575 m)   Wt 196 lb 12.8 oz (89.3 kg)   LMP 11/05/2019   SpO2 100%   BMI 36.00 kg/m    General: Alert, no acute distress Pelvic Exam chaperoned by CMA Alexis        External: normal female genitalia without lesions or masses        Vagina: normal without lesions or masses        Cervix: normal without lesions or masses           ASSESSMENT/PLAN:   Cervical cancer screening UPT negative Declines BCP, not using condoms, has not has menses in 12 months Pap smear: performed Samples for Wet prep, GC/Chlamydia obtained HIV, RPR Follow up with results  Menopausal and perimenopausal disorder Has not had menses in 12 months.  Reports possible hot flashes.  No depressed mood, brain fogginess. UPT negative.  Patient in age range for menopause.  Do not think lab work would be beneficial given would not change treatment plan. -MHT resources provided, if she would like to discuss this more can schedule appointment in future.  If she meets criteria and has no risk factors, cardiac, VTE, breast ca, and is having vasomotor symptoms affecting ADL, could benefit from low dose estrogen patch and intermittent progesterone therapy. -Follow up as needed  Prediabetes A1c 6.2.  Remains unchanged. -Did not refill Topamax as this was originally prescribed by [facility].  Recommended for patient to follow up if wanting to continue  medications and program. -Repeat A1c in 12 months    Flu vaccine today  Carollee Leitz, MD [place]

## 2020-11-05 LAB — HIV ANTIBODY (ROUTINE TESTING W REFLEX): HIV Screen 4th Generation wRfx: NONREACTIVE

## 2020-11-06 LAB — CYTOLOGY - PAP
Adequacy: ABSENT
Chlamydia: NEGATIVE
Comment: NEGATIVE
Comment: NEGATIVE
Comment: NORMAL
Diagnosis: NEGATIVE
High risk HPV: NEGATIVE
Neisseria Gonorrhea: NEGATIVE

## 2020-11-06 LAB — T PALLIDUM ANTIBODY, EIA: T pallidum Antibody, EIA: NEGATIVE

## 2020-11-06 LAB — RPR W/REFLEX TO TREPSURE: RPR: NONREACTIVE

## 2020-11-08 ENCOUNTER — Encounter: Payer: Self-pay | Admitting: Family Medicine

## 2020-11-08 DIAGNOSIS — N959 Unspecified menopausal and perimenopausal disorder: Secondary | ICD-10-CM | POA: Insufficient documentation

## 2020-11-08 NOTE — Assessment & Plan Note (Signed)
A1c 6.2.  Remains unchanged. -Did not refill Topamax as this was originally prescribed by [facility].  Recommended for patient to follow up if wanting to continue medications and program. -Repeat A1c in 12 months

## 2020-11-08 NOTE — Assessment & Plan Note (Signed)
Has not had menses in 12 months.  Reports possible hot flashes.  No depressed mood, brain fogginess. UPT negative.  Patient in age range for menopause.  Do not think lab work would be beneficial given would not change treatment plan. -MHT resources provided, if she would like to discuss this more can schedule appointment in future.  If she meets criteria and has no risk factors, cardiac, VTE, breast ca, and is having vasomotor symptoms affecting ADL, could benefit from low dose estrogen patch and intermittent progesterone therapy. -Follow up as needed

## 2020-11-08 NOTE — Assessment & Plan Note (Signed)
UPT negative Declines BCP, not using condoms, has not has menses in 12 months Pap smear: performed Samples for Wet prep, GC/Chlamydia obtained HIV, RPR Follow up with results

## 2020-11-14 ENCOUNTER — Telehealth: Payer: Self-pay | Admitting: Family Medicine

## 2020-11-14 ENCOUNTER — Encounter: Payer: Self-pay | Admitting: Family Medicine

## 2020-11-14 NOTE — Telephone Encounter (Signed)
Patient would like for Dr. Volanda Napoleon to write a letter for her employer. It needs to state that she was seen in our office on 11-04-20 for a physical exam.   She would like this letter to be sent to her on mychart.   Please advise.

## 2020-11-14 NOTE — Telephone Encounter (Signed)
Letter sent.

## 2020-11-18 ENCOUNTER — Other Ambulatory Visit: Payer: Self-pay | Admitting: Family Medicine

## 2020-11-18 DIAGNOSIS — D508 Other iron deficiency anemias: Secondary | ICD-10-CM

## 2020-11-18 DIAGNOSIS — J3089 Other allergic rhinitis: Secondary | ICD-10-CM

## 2021-01-02 ENCOUNTER — Ambulatory Visit: Payer: No Typology Code available for payment source

## 2021-01-16 ENCOUNTER — Ambulatory Visit: Payer: No Typology Code available for payment source | Admitting: Family Medicine

## 2021-02-05 ENCOUNTER — Other Ambulatory Visit: Payer: Self-pay

## 2021-02-05 ENCOUNTER — Ambulatory Visit (INDEPENDENT_AMBULATORY_CARE_PROVIDER_SITE_OTHER): Payer: No Typology Code available for payment source | Admitting: Family Medicine

## 2021-02-05 ENCOUNTER — Encounter: Payer: Self-pay | Admitting: Family Medicine

## 2021-02-05 VITALS — BP 124/72 | HR 72 | Ht 62.0 in | Wt 205.0 lb

## 2021-02-05 DIAGNOSIS — R7303 Prediabetes: Secondary | ICD-10-CM

## 2021-02-05 DIAGNOSIS — Z1211 Encounter for screening for malignant neoplasm of colon: Secondary | ICD-10-CM | POA: Diagnosis not present

## 2021-02-05 DIAGNOSIS — R600 Localized edema: Secondary | ICD-10-CM

## 2021-02-05 DIAGNOSIS — E611 Iron deficiency: Secondary | ICD-10-CM | POA: Diagnosis not present

## 2021-02-05 DIAGNOSIS — S161XXA Strain of muscle, fascia and tendon at neck level, initial encounter: Secondary | ICD-10-CM | POA: Diagnosis not present

## 2021-02-05 NOTE — Patient Instructions (Addendum)
Thank you for coming to see me today. It was a pleasure.   We will get some labs today.  If they are abnormal or we need to do something about them, I will call you.  If they are normal, I will send you a message on MyChart (if it is active) or a letter in the mail.  If you don't hear from [place] in 2 weeks, please call the office at the number below.   Avoid NSAIDs, BC, Ibuprofen, Voltaren, Excedrin.  Tylenol for discomfort. Neck exercises below.  Compression stockings Stand frequently at work  Weight Management will help with leg swelling  Please follow-up with PCP in 4 weeks  If you have any questions or concerns, please do not hesitate to call the office at [phone].  Best,   Carollee Leitz, MD    Chronic Venous Insufficiency Chronic venous insufficiency is a condition where the leg veins cannot effectively pump blood from the legs to the heart. This happens when the vein walls are either stretched, weakened, or damaged, or when the valves inside the vein are damaged. With the right treatment, you should be able to continue with an active life. This condition is also called venous stasis. What are the causes? Common causes of this condition include: High blood pressure inside the veins (venous hypertension). Sitting or standing too long, causing increased blood pressure in the leg veins. A blood clot that blocks blood flow in a vein (deep vein thrombosis, DVT). Inflammation of a vein (phlebitis) that causes a blood clot to form. Tumors in the pelvis that cause blood to back up. What increases the risk? The following factors may make you more likely to develop this condition: Having a family history of this condition. Obesity. Pregnancy. Living without enough regular physical activity or exercise (sedentary lifestyle). Smoking. Having a job that requires long periods of standing or sitting in one place. Being a certain age. Women in their 50s and 36s and men in their 58s are  more likely to develop this condition. What are the signs or symptoms? Symptoms of this condition include: Veins that are enlarged, bulging, or twisted (varicose veins). Skin breakdown or ulcers. Reddened skin or dark discoloration of skin on the leg between the knee and ankle. Brown, smooth, tight, and painful skin just above the ankle, usually on the inside of the leg (lipodermatosclerosis). Swelling of the legs. How is this diagnosed? This condition may be diagnosed based on: Your medical history. A physical exam. Tests, such as: A procedure that creates an image of a blood vessel and nearby organs and provides information about blood flow through the blood vessel (duplex ultrasound). A procedure that tests blood flow (plethysmography). A procedure that looks at the veins using X-ray and dye (venogram). How is this treated? The goals of treatment are to help you return to an active life and to minimize pain or disability. Treatment depends on the severity of your condition, and it may include: Wearing compression stockings. These can help relieve symptoms and help prevent your condition from getting worse. However, they do not cure the condition. Sclerotherapy. This procedure involves an injection of a solution that shrinks damaged veins. Surgery. This may involve: Removing a diseased vein (vein stripping). Cutting off blood flow through the vein (laser ablation surgery). Repairing or reconstructing a valve within the affected vein. Follow these instructions at home:   Wear compression stockings as told by your health care provider. These stockings help to prevent blood clots and  reduce swelling in your legs. Take over-the-counter and prescription medicines only as told by your health care provider. Stay active by exercising, walking, or doing different activities. Ask your health care provider what activities are safe for you and how much exercise you need. Drink enough fluid to keep  your urine pale yellow. Do not use any products that contain nicotine or tobacco, such as cigarettes, e-cigarettes, and chewing tobacco. If you need help quitting, ask your health care provider. Keep all follow-up visits as told by your health care provider. This is important. Contact a health care provider if you: Have redness, swelling, or more pain in the affected area. See a red streak or line that goes up or down from the affected area. Have skin breakdown or skin loss in the affected area, even if the breakdown is small. Get an injury in the affected area. Get help right away if: You get an injury and an open wound in the affected area. You have: Severe pain that does not get better with medicine. Sudden numbness or weakness in the foot or ankle below the affected area. Trouble moving your foot or ankle. A fever. Worse or persistent symptoms. Chest pain. Shortness of breath. Summary Chronic venous insufficiency is a condition where the leg veins cannot effectively pump blood from the legs to the heart. Chronic venous insufficiency occurs when the vein walls become stretched, weakened, or damaged, or when valves within the vein are damaged. Treatment depends on how severe your condition is. It often involves wearing compression stockings and may involve having a procedure. Make sure you stay active by exercising, walking, or doing different activities. Ask your health care provider what activities are safe for you and how much exercise you need. This information is not intended to replace advice given to you by your health care provider. Make sure you discuss any questions you have with your health care provider. Document Revised: 03/25/2020 Document Reviewed: 03/25/2020 Elsevier Patient Education  2022 [place].    Neck Exercises Ask your health care provider which exercises are safe for you. Do exercises exactly as told by your health care provider and adjust them as  directed. It is normal to feel mild stretching, pulling, tightness, or discomfort as you do these exercises. Stop right away if you feel sudden pain or your pain gets worse. Do not begin these exercises until told by your health care provider. Neck exercises can be important for many reasons. They can improve strength and maintain flexibility in your neck, which will help your upper back and prevent neck pain. Stretching exercises Rotation neck stretching  Sit in a chair or stand up. Place your feet flat on the floor, shoulder-width apart. Slowly turn your head (rotate) to the right until a slight stretch is felt. Turn it all the way to the right so you can look over your right shoulder. Do not tilt or tip your head. Hold this position for 10-30 seconds. Slowly turn your head (rotate) to the left until a slight stretch is felt. Turn it all the way to the left so you can look over your left shoulder. Do not tilt or tip your head. Hold this position for 10-30 seconds. Repeat __________ times. Complete this exercise __________ times a day. Neck retraction  Sit in a sturdy chair or stand up. Look straight ahead. Do not bend your neck. Use your fingers to push your chin backward (retraction). Do not bend your neck for this movement. Continue to face straight  ahead. If you are doing the exercise properly, you will feel a slight sensation in your throat and a stretch at the back of your neck. Hold the stretch for 1-2 seconds. Repeat __________ times. Complete this exercise __________ times a day. Strengthening exercises Neck press  Lie on your back on a firm bed or on the floor with a pillow under your head. Use your neck muscles to push your head down on the pillow and straighten your spine. Hold the position as well as you can. Keep your head facing up (in a neutral position) and your chin tucked. Slowly count to 5 while holding this position. Repeat __________ times. Complete this exercise  __________ times a day. Isometrics These are exercises in which you strengthen the muscles in your neck while keeping your neck still (isometrics). Sit in a supportive chair and place your hand on your forehead. Keep your head and face facing straight ahead. Do not flex or extend your neck while doing isometrics. Push forward with your head and neck while pushing back with your hand. Hold for 10 seconds. Do the sequence again, this time putting your hand against the back of your head. Use your head and neck to push backward against the hand pressure. Finally, do the same exercise on either side of your head, pushing sideways against the pressure of your hand. Repeat __________ times. Complete this exercise __________ times a day. Prone head lifts  Lie face-down (prone position), resting on your elbows so that your chest and upper back are raised. Start with your head facing downward, near your chest. Position your chin either on or near your chest. Slowly lift your head upward. Lift until you are looking straight ahead. Then continue lifting your head as far back as you can comfortably stretch. Hold your head up for 5 seconds. Then slowly lower it to your starting position. Repeat __________ times. Complete this exercise __________ times a day. Supine head lifts  Lie on your back (supine position), bending your knees to point to the ceiling and keeping your feet flat on the floor. Lift your head slowly off the floor, raising your chin toward your chest. Hold for 5 seconds. Repeat __________ times. Complete this exercise __________ times a day. Scapular retraction  Stand with your arms at your sides. Look straight ahead. Slowly pull both shoulders (scapulae) backward and downward (retraction) until you feel a stretch between your shoulder blades in your upper back. Hold for 10-30 seconds. Relax and repeat. Repeat __________ times. Complete this exercise __________ times a day. Contact a  health care provider if: Your neck pain or discomfort gets worse when you do an exercise. Your neck pain or discomfort does not improve within 2 hours after you exercise. If you have any of these problems, stop exercising right away. Do not do the exercises again unless your health care provider says that you can. Get help right away if: You develop sudden, severe neck pain. If this happens, stop exercising right away. Do not do the exercises again unless your health care provider says that you can. This information is not intended to replace advice given to you by your health care provider. Make sure you discuss any questions you have with your health care provider. Document Revised: 07/08/2020 Document Reviewed: 07/08/2020 Elsevier Patient Education  [place].
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
## 2021-02-06 LAB — COMPREHENSIVE METABOLIC PANEL
ALT: 21 IU/L (ref 0–32)
AST: 21 IU/L (ref 0–40)
Albumin/Globulin Ratio: 1.5 (ref 1.2–2.2)
Albumin: 4.3 g/dL (ref 3.8–4.8)
Alkaline Phosphatase: 115 IU/L (ref 44–121)
BUN/Creatinine Ratio: 17 (ref 9–23)
BUN: 15 mg/dL (ref 6–24)
Bilirubin Total: 0.6 mg/dL (ref 0.0–1.2)
CO2: 24 mmol/L (ref 20–29)
Calcium: 10.2 mg/dL (ref 8.7–10.2)
Chloride: 101 mmol/L (ref 96–106)
Creatinine, Ser: 0.88 mg/dL (ref 0.57–1.00)
Globulin, Total: 2.9 g/dL (ref 1.5–4.5)
Glucose: 75 mg/dL (ref 70–99)
Potassium: 5 mmol/L (ref 3.5–5.2)
Sodium: 141 mmol/L (ref 134–144)
Total Protein: 7.2 g/dL (ref 6.0–8.5)
eGFR: 80 mL/min/{1.73_m2} (ref >59)

## 2021-02-06 LAB — URINALYSIS, ROUTINE W REFLEX MICROSCOPIC
Bilirubin, UA: NEGATIVE
Glucose, UA: NEGATIVE
Ketones, UA: NEGATIVE
Leukocytes,UA: NEGATIVE
Nitrite, UA: NEGATIVE
Protein,UA: NEGATIVE
RBC, UA: NEGATIVE
Specific Gravity, UA: 1.01 (ref 1.005–1.030)
Urobilinogen, Ur: 0.2 mg/dL (ref 0.2–1.0)
pH, UA: 6 (ref 5.0–7.5)

## 2021-02-06 LAB — CBC
Hematocrit: 36.1 % (ref 34.0–46.6)
Hemoglobin: 11.9 g/dL (ref 11.1–15.9)
MCH: 23.3 pg — ABNORMAL LOW (ref 26.6–33.0)
MCHC: 33 g/dL (ref 31.5–35.7)
MCV: 71 fL — ABNORMAL LOW (ref 79–97)
Platelets: 323 10*3/uL (ref 150–450)
RBC: 5.11 x10E6/uL (ref 3.77–5.28)
RDW: 15.8 % — ABNORMAL HIGH (ref 11.7–15.4)
WBC: 11.3 10*3/uL — ABNORMAL HIGH (ref 3.4–10.8)

## 2021-02-06 LAB — FERRITIN: Ferritin: 154 ng/mL — ABNORMAL HIGH (ref 15–150)

## 2021-02-13 ENCOUNTER — Encounter: Payer: Self-pay | Admitting: Family Medicine

## 2021-02-13 DIAGNOSIS — R600 Localized edema: Secondary | ICD-10-CM | POA: Insufficient documentation

## 2021-02-13 NOTE — Assessment & Plan Note (Signed)
Intermittent lower extremity edema likely secondary from increased in load.  Weight has increased since last visit by 9 pounds since last visit in October, and 16 pounds in 1 year.  Low suspicion for DVT given bilateral edema and Wells score 0.  Less concerning for CHF given no shortness of breath on exertion or chest pain and lung exam shows no signs of overload.  Considered NSAID use, renal causes and iron deficiency as possible causes of edema. -CBC, Ferritin, U/A and CMet today. -Compression stockings -Discussed weight management options, patient has been followed by healthy weight and wellness clinic.  She plans to return if possible. -Avoid NSAIDs -Good supportive footwear -Frequent standing at work from sitting position -Follow-up results -Strict return precautions -Follow-up with PCP if symptoms do not improve

## 2021-02-13 NOTE — Assessment & Plan Note (Signed)
Suspicion for radicular pathology.  Tenderness over trapezius muscle pain has been relieved with BC daily. -Given recent bilateral lower extremity edema encouraged to avoid BC daily. -Tylenol daily for pain -Gentle neck exercises provided -Heat/ice as needed -Strict return precautions -Follow-up with PCP in 4 weeks.

## 2021-02-13 NOTE — Assessment & Plan Note (Signed)
Review of patient's chart reveals that she had an A1c of 6.6 back in 11/2019.  Since then her A1c has been controlled and less than 6.4, in the prediabetic range.  However given that she has had an A1c in the diabetic range I think likely a diagnosis of diabetes type 2 is appropriate and will need to initiate appropriate healthcare maintenance. She is not currently on statin, ACE.  Blood pressure is well controlled today.  LDL however is not at goal of less than 70.  10-year ASCVD borderline risk 5.8%  -Patient is scheduled to follow-up with me in 4 weeks and will revisit this at that time.

## 2021-02-13 NOTE — Assessment & Plan Note (Signed)
Possible etiology of bilateral lower extremity edema.  On ferrous sulfate daily. -Repeat CBC ferritin today. -If ferritin elevated can discontinue ferrous sulfate. -Follow-up with lab results

## 2021-02-24 ENCOUNTER — Other Ambulatory Visit: Payer: Self-pay | Admitting: Family Medicine

## 2021-02-26 ENCOUNTER — Encounter (HOSPITAL_BASED_OUTPATIENT_CLINIC_OR_DEPARTMENT_OTHER): Payer: Self-pay | Admitting: Emergency Medicine

## 2021-02-26 ENCOUNTER — Emergency Department (HOSPITAL_BASED_OUTPATIENT_CLINIC_OR_DEPARTMENT_OTHER)
Admission: EM | Admit: 2021-02-26 | Discharge: 2021-02-26 | Disposition: A | Discharge status: Home / Self Care | Payer: No Typology Code available for payment source | Attending: Emergency Medicine | Admitting: Emergency Medicine

## 2021-02-26 ENCOUNTER — Other Ambulatory Visit: Payer: Self-pay

## 2021-02-26 DIAGNOSIS — Z5321 Procedure and treatment not carried out due to patient leaving prior to being seen by health care provider: Secondary | ICD-10-CM | POA: Diagnosis not present

## 2021-02-26 DIAGNOSIS — M542 Cervicalgia: Secondary | ICD-10-CM | POA: Insufficient documentation

## 2021-02-26 MED ORDER — OXYCODONE-ACETAMINOPHEN 5-325 MG PO TABS
1.0000 | ORAL_TABLET | ORAL | Status: DC | PRN
  Administered 2021-02-26: 1 via ORAL
  Filled 2021-02-26: qty 1

## 2021-02-26 NOTE — ED Triage Notes (Signed)
Back of neck pain radiates to sides and ears. Was seen for it 2 weeks ago pain resolved. Came back around 1 pm. Denies injury.

## 2021-02-27 ENCOUNTER — Ambulatory Visit (HOSPITAL_COMMUNITY)
Admission: EM | Admit: 2021-02-27 | Discharge: 2021-02-27 | Disposition: A | Discharge status: Home / Self Care | Payer: 59 | Attending: Family Medicine | Admitting: Family Medicine

## 2021-02-27 ENCOUNTER — Encounter (HOSPITAL_COMMUNITY): Payer: Self-pay | Admitting: Emergency Medicine

## 2021-02-27 DIAGNOSIS — M542 Cervicalgia: Secondary | ICD-10-CM

## 2021-02-27 MED ORDER — KETOROLAC TROMETHAMINE 30 MG/ML IJ SOLN
30.0000 mg | Freq: Once | INTRAMUSCULAR | Status: AC
  Administered 2021-02-27: 30 mg via INTRAMUSCULAR

## 2021-02-27 MED ORDER — TIZANIDINE HCL 4 MG PO TABS: 4.0000 mg | ORAL_TABLET | Freq: Three times a day (TID) | ORAL | 0 refills | Status: DC | PRN

## 2021-02-27 MED ORDER — KETOROLAC TROMETHAMINE 30 MG/ML IJ SOLN
INTRAMUSCULAR | Status: AC
  Filled 2021-02-27: qty 1

## 2021-02-27 MED ORDER — IBUPROFEN 800 MG PO TABS: 800.0000 mg | ORAL_TABLET | Freq: Three times a day (TID) | ORAL | 0 refills | Status: AC | PRN

## 2021-02-27 NOTE — ED Provider Notes (Addendum)
[place]    CSN: [id number] Arrival date & time: 02/27/21  1234      History   Chief Complaint Chief Complaint  Patient presents with   Neck Pain    HPI Rebecca Olson is a 51 y.o. female.    Neck Pain Here with a history of bilateral neck pain that began yesterday.  No fever or chills.  To turn her head or to look up.  No rash.  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 03/03/2020   At risk for dehydration 02/18/2020   At risk for deficient knowledge of diabetes mellitus 01/15/2020   Other constipation 12/31/2019   At risk for hyperglycemia 12/31/2019   Absolute anemia 12/18/2019   At risk for depression 12/18/2019   Stress reaction with emotional eating 12/18/2019   Environmental and seasonal allergies 10/17/2019   Prediabetes 10/17/2019   Vitamin D deficiency 10/03/2019   Iron deficiency 10/03/2019   Microcytic anemia 10/03/2019   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   Abnormal uterine bleeding 01/04/2019   Cervical cancer screening 05/30/2017   Breast pain, left 05/30/2017   Pruritus 08/20/2016   Cervical strain, acute, initial encounter 02/18/2016   Breast discharge 08/29/2015   Healthcare maintenance 03/03/2015   Abdominal pain, epigastric 07/03/2013   Menorrhagia 07/03/2013   Back muscle spasm 05/08/2013   Allergic rhinitis 12/12/2012   Rash 10/13/2010   Obesity 03/24/2006   Asthma, moderate persistent 03/24/2006    Past Surgical History:  Procedure Laterality Date   CARPAL TUNNEL WITH CUBITAL TUNNEL  2011    OB History     Gravida  5   Para  2   Term  2   Preterm      AB  3   Living  2      SAB      IAB  3    Ectopic      Multiple      Live Births  2            Home Medications    Prior to Admission medications   Medication Sig Start Date End Date Taking? Authorizing Provider  ibuprofen (ADVIL) 800 MG tablet Take 1 tablet (800 mg total) by mouth every 8 (eight) hours as needed (pain). 02/27/21  Yes Montavius Subramaniam, Gwenlyn Perking, MD  tiZANidine (ZANAFLEX) 4 MG tablet Take 1 tablet (4 mg total) by mouth every 8 (eight) hours as needed for muscle spasms. 02/27/21  Yes Barrett Henle, MD  albuterol (VENTOLIN HFA) 108 (90 Base) MCG/ACT inhaler Inhale 2 puffs into the lungs every 6 (six) hours as needed for wheezing or shortness of breath. 08/16/19   Carollee Leitz, MD  azelastine (ASTELIN) 0.1 % nasal spray Place into the nose.    [provider]  doxycycline (MONODOX) 50 MG capsule Take 50 mg by mouth daily. 11/23/19   [provider]  ferrous sulfate 325 (65 FE) MG tablet TAKE 1 TABLET BY MOUTH EVERY DAY WITH BREAKFAST 11/18/20   Carollee Leitz, MD  fluocinonide ointment (LIDEX) 0.05 % Apply topically. 11/22/19   [provider]  levocetirizine (XYZAL) 5 MG tablet TAKE  1 TABLET BY MOUTH EVERY DAY IN THE EVENING 11/18/20   Carollee Leitz, MD  montelukast (SINGULAIR) 10 MG tablet TAKE 1 TABLET BY MOUTH EVERYDAY AT BEDTIME 01/15/20   Opalski, Deborah, DO  polyethylene glycol (MIRALAX / GLYCOLAX) 17 g packet Take 17 g by mouth 2 (two) times daily. Until stooling regularly 01/15/20   Opalski, Neoma Laming, DO  sennosides-docusate sodium (SENOKOT-S) 8.6-50 MG tablet Take 2 tablets by mouth daily. 01/15/20   Opalski, Neoma Laming, DO  topiramate (TOPAMAX) 50 MG tablet TAKE 1 TABLET BY MOUTH TWICE A DAY 07/17/20   Carollee Leitz, MD  triamcinolone (KENALOG) 0.025 % ointment Apply 1 application topically 2 (two) times daily. 08/20/16   Harriet Butte, DO  Vitamin D, Ergocalciferol, (DRISDOL) 1.25 MG (50000 UNIT) CAPS capsule Take one tablet wkly 03/31/20   Mellody Dance, DO    Family History Family  History  Problem Relation Age of Onset   Diabetes Mother    Asthma Mother    Hypertension Mother    Allergic rhinitis Mother    Obesity Mother    Cancer Father        pancreatic ca   Healthy Brother    Angioedema Neg Hx    Atopy Neg Hx    Eczema Neg Hx    Immunodeficiency Neg Hx    Urticaria Neg Hx     Social History Social History   Tobacco Use   Smoking status: Never    Passive exposure: Yes   Smokeless tobacco: Never   Tobacco comments:    husband smokes inside the house  Vaping Use   Vaping Use: Never used  Substance Use Topics   Alcohol use: No   Drug use: No     Allergies   Patient has no known allergies.   Review of Systems Review of Systems  Musculoskeletal:  Positive for neck pain.    Physical Exam Triage Vital Signs ED Triage Vitals  Enc Vitals Group     BP 02/27/21 1340 124/73     Pulse Rate 02/27/21 1340 83     Resp 02/27/21 1340 18     Temp 02/27/21 1340 98.3 F (36.8 C)     Temp Source 02/27/21 1340 Oral     SpO2 02/27/21 1339 98 %     Weight --      Height --      Head Circumference --      Peak Flow --      Pain Score 02/27/21 1336 9     Pain Loc --      Pain Edu? --      Excl. in [place]? --    No data found.  Updated Vital Signs BP 124/73 (BP Location: Right Arm)    Pulse 83    Temp 98.3 F (36.8 C) (Oral)    Resp 18    LMP  (LMP Unknown)    SpO2 98%   Visual Acuity Right Eye Distance:   Left Eye Distance:   Bilateral Distance:    Right Eye Near:   Left Eye Near:    Bilateral Near:     Physical Exam Vitals reviewed.  Constitutional:      Appearance: She is not toxic-appearing.     Comments: In obvious discomfort, not turning her head while talking  HENT:     Nose: Nose normal.     Mouth/Throat:     Mouth: Mucous membranes are moist.     Pharynx: No posterior oropharyngeal erythema.  Eyes:  Extraocular Movements: Extraocular movements intact.     Pupils: Pupils are equal, round, and reactive to light.  Neck:      Comments: Tender over bilateral trapezius. Cardiovascular:     Rate and Rhythm: Normal rate and regular rhythm.     Heart sounds: No murmur heard. Pulmonary:     Effort: Pulmonary effort is normal.     Breath sounds: Normal breath sounds.  Lymphadenopathy:     Cervical: No cervical adenopathy.  Skin:    Capillary Refill: Capillary refill takes less than 2 seconds.     Findings: No rash.  Neurological:     General: No focal deficit present.     Mental Status: She is alert.  Psychiatric:        Behavior: Behavior normal.     UC Treatments / Results  Labs (all labs ordered are listed, but only abnormal results are displayed) Labs Reviewed - No data to display  EKG   Radiology No results found.  Procedures Procedures (including critical care time)  Medications Ordered in UC Medications  ketorolac (TORADOL) 30 MG/ML injection 30 mg (has no administration in time range)    Initial Impression / Assessment and Plan / UC Course  I have reviewed the triage vital signs and the nursing notes.  Pertinent labs & imaging results that were available during my care of the patient were reviewed by me and considered in my medical decision making (see chart for details).     Will treat for pain and muscle spasm. Discussed calling her primary for poss PT. Given info on neck stretches, to start in a couple of days Final Clinical Impressions(s) / UC Diagnoses   Final diagnoses:  Neck pain     Discharge Instructions      Take ibuprofen 800 mg 1 every 8 hours as needed for pain.  Take tizanidine 4 mg 1 every 8 hours as needed for muscle spasm or muscle pain.  Use a heating pad or warm compresses on the painful areas.  Rest with your head supported     ED Prescriptions     Medication Sig Dispense Auth. Provider   ibuprofen (ADVIL) 800 MG tablet Take 1 tablet (800 mg total) by mouth every 8 (eight) hours as needed (pain). 21 tablet Vaniya Augspurger, Gwenlyn Perking, MD   tiZANidine  (ZANAFLEX) 4 MG tablet Take 1 tablet (4 mg total) by mouth every 8 (eight) hours as needed for muscle spasms. 30 tablet Ayelet Gruenewald, Gwenlyn Perking, MD      I have reviewed the PDMP during this encounter.   Barrett Henle, MD 02/27/21 1426    Barrett Henle, MD 02/27/21 [phone]

## 2021-02-27 NOTE — Discharge Instructions (Addendum)
Take ibuprofen 800 mg 1 every 8 hours as needed for pain.  Take tizanidine 4 mg 1 every 8 hours as needed for muscle spasm or muscle pain.  Use a heating pad or warm compresses on the painful areas.  Rest with your head supported

## 2021-02-27 NOTE — ED Triage Notes (Signed)
Pt c/o neck pain radiating to shoulders and jaws. Pt stated pain started yesterday while at work.

## 2021-03-02 NOTE — Telephone Encounter (Signed)
Please call patient to have her contact her Dermatologist for renewal of this prescription.  Thank you

## 2021-03-16 ENCOUNTER — Ambulatory Visit: Payer: No Typology Code available for payment source

## 2021-03-16 NOTE — Progress Notes (Deleted)
° ° °  SUBJECTIVE:   CHIEF COMPLAINT / HPI:   ***  PERTINENT  PMH / PSH: ***  OBJECTIVE:   LMP  (LMP Unknown)    General: Alert, no acute distress Cardio: Normal S1 and S2, RRR, no r/m/g Pulm: CTAB, normal work of breathing Abdomen: Bowel sounds normal. Abdomen soft and non-tender.  Extremities: No peripheral edema.  Neuro: Cranial nerves grossly intact   ASSESSMENT/PLAN:   No problem-specific Assessment & Plan notes found for this encounter.     Carollee Leitz, MD [place]

## 2021-03-17 NOTE — Progress Notes (Signed)
° ° °  SUBJECTIVE:   CHIEF COMPLAINT / HPI:   Neck Pain Follow-Up Wynell D Gorelick is a 51 y.o. female who presents to the [facility] clinic today for follow up. She was seen at [facility] on 2/3 for neck pain. Vitals stable, physical exam notable for obvious discomfort and not turning head while talking, tenderness to b/l trapezius. She was given toradol at [facility] and discharged with instructions to do neck stretches,  take ibuprofen, tizanidine and use heating pads or warm compresses.   The medications and heating pads are helping. Since her UC visit these episodes seem to happen sporadically. She can go days and sometimes weeks without having any pain.  She does the neck exercises which help some. She works at [employer] and is on the computer for most of the day. Also notes a "side pain that won't go away, I have to stand up". Describes the pain as a dull pain. Hasn't tried anything for this pain. It has been present for the last week intermittently.  It does not radiate.  No numbness or tingling.  Abdominal pain or urinary symptoms.  No headache, visual changes, trauma. No chest pain, SOB.   PERTINENT  PMH / PSH: Cervical Strain   OBJECTIVE:   BP (!) 112/49    Pulse 87    Ht 5' 1.5" (1.562 m)    Wt 211 lb 12.8 oz (96.1 kg)    LMP  (LMP Unknown)    SpO2 100%    BMI 39.37 kg/m    General: NAD, pleasant, able to participate in exam HEENT: Normocephalic with some alopecia present (chronic) Neck: Normal AROM of neck in flexion, extension, side-bending and rotation. Upper trapezius tenderness b/l with increased muscle tension. No c-spine tenderness.  Respiratory: Breathing comfortably on room air  Neuro: alert, no obvious focal deficits Psych: Normal affect and mood  ASSESSMENT/PLAN:   Neck pain, musculoskeletal Improved since her urgent care visit with conservative treatments of ibuprofen, tizanidine, stretches, heating pads.  No red flags including angina symptoms, neurodeficits,  headaches, etc.  Examination significant for paraspinal muscle tenderness increased muscle tension.  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  Recommended exercise, and weight loss to help her overall health.  She is amenable to physical therapy to help with exercises, referral placed today.  Musculoskeletal pain Reports pain in area above right hip- could be related to iliopsoas, iliacus or TFL. Seems to improve with standing, worse with sitting. No trauma. Has normal gait. Discussed exercise and benefits of weight loss in helping overall health. Referral to PT placed for exercises. Otherwise continue conservative treatments with stretching, warm compresses, pain control with Tylenol/Motrin as needed.     Sharion Settler, [place]

## 2021-03-17 NOTE — Patient Instructions (Addendum)
It was wonderful to see you today.  Please bring ALL of your medications with you to every visit.   Today we talked about:  -Your referral for colonoscopy was sent to [facility].   [phone], [place], [place] [address]  -Continue to do your neck stretches, heating pads, Motrin, Tizanidine as needed for your neck. I think a lot of this is related to posture and sitting all day at work.  -I have placed a referral to Physical therapy.   Thank you for choosing [facility].   Please call [phone] with any questions about today's appointment.  Please be sure to schedule follow up at the front  desk before you leave today.   Sharion Settler, DO PGY-2 Family Medicine

## 2021-03-18 ENCOUNTER — Ambulatory Visit (INDEPENDENT_AMBULATORY_CARE_PROVIDER_SITE_OTHER): Payer: 59 | Admitting: Family Medicine

## 2021-03-18 ENCOUNTER — Other Ambulatory Visit: Payer: Self-pay

## 2021-03-18 VITALS — BP 112/49 | HR 87 | Ht 61.5 in | Wt 211.8 lb

## 2021-03-18 DIAGNOSIS — M7918 Myalgia, other site: Secondary | ICD-10-CM | POA: Insufficient documentation

## 2021-03-18 DIAGNOSIS — M542 Cervicalgia: Secondary | ICD-10-CM | POA: Diagnosis not present

## 2021-03-18 NOTE — Assessment & Plan Note (Addendum)
Reports pain in area above right hip- could be related to iliopsoas, iliacus or TFL. Seems to improve with standing, worse with sitting. No trauma. Has normal gait. Discussed exercise and benefits of weight loss in helping overall health. Referral to PT placed for exercises. Otherwise continue conservative treatments with stretching, warm compresses, pain control with Tylenol/Motrin as needed.

## 2021-03-18 NOTE — Assessment & Plan Note (Signed)
Improved since her urgent care visit with conservative treatments of ibuprofen, tizanidine, stretches, heating pads.  No red flags including angina symptoms, neurodeficits, headaches, etc.  Examination significant for paraspinal muscle tenderness increased muscle tension.  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  Recommended exercise, and weight loss to help her overall health.  She is amenable to physical therapy to help with exercises, referral placed today.

## 2021-06-23 ENCOUNTER — Other Ambulatory Visit: Payer: Self-pay

## 2021-06-23 MED ORDER — ALBUTEROL SULFATE HFA 108 (90 BASE) MCG/ACT IN AERS: 2.0000 | INHALATION_SPRAY | Freq: Four times a day (QID) | RESPIRATORY_TRACT | 6 refills | Status: AC | PRN

## 2021-06-26 ENCOUNTER — Ambulatory Visit (INDEPENDENT_AMBULATORY_CARE_PROVIDER_SITE_OTHER): Payer: 59 | Admitting: Family Medicine

## 2021-06-26 ENCOUNTER — Encounter: Payer: Self-pay | Admitting: Family Medicine

## 2021-06-26 DIAGNOSIS — H6120 Impacted cerumen, unspecified ear: Secondary | ICD-10-CM | POA: Insufficient documentation

## 2021-06-26 DIAGNOSIS — H6123 Impacted cerumen, bilateral: Secondary | ICD-10-CM | POA: Diagnosis not present

## 2021-06-26 DIAGNOSIS — J309 Allergic rhinitis, unspecified: Secondary | ICD-10-CM | POA: Diagnosis not present

## 2021-06-26 DIAGNOSIS — J3089 Other allergic rhinitis: Secondary | ICD-10-CM | POA: Diagnosis not present

## 2021-06-26 MED ORDER — FLUTICASONE PROPIONATE 50 MCG/ACT NA SUSP: 2.0000 | Freq: Every day | NASAL | 1 refills | Status: DC

## 2021-06-26 MED ORDER — LEVOCETIRIZINE DIHYDROCHLORIDE 5 MG PO TABS: 5.0000 mg | ORAL_TABLET | Freq: Every evening | ORAL | 1 refills | Status: AC

## 2021-06-26 MED ORDER — MONTELUKAST SODIUM 10 MG PO TABS: 10.0000 mg | ORAL_TABLET | Freq: Every day | ORAL | 1 refills | Status: DC

## 2021-06-26 NOTE — Patient Instructions (Signed)
It was nice seeing you. I refilled some of your sinus medications. Please, resume them as soon as possible. I also started you on Nasonex

## 2021-06-26 NOTE — Progress Notes (Signed)
    SUBJECTIVE:   CHIEF COMPLAINT / HPI:   Sinus Problem This is a new (Nasal drainage and ear stopping up for about 1 week) problem. The problem has been waxing and waning since onset. Maximum temperature: No certain about fever at home. Her pain is at a severity of 0/10. Associated symptoms include sinus pressure and sneezing. Pertinent negatives include no congestion, ear pain, headaches, hoarse voice, shortness of breath or sore throat. (Nasal drainage drains to the back of her throat sometimes. This causes cough and provokes her asthma. In the morning time, her throat is irritated. Most of these symptoms occur early in the morning and gets better as the day passes. She has hx of hay allergy. ) Past treatments include saline sprays (Netty potti, it burns and last used 1 week ago). The treatment provided mild relief.    PERTINENT  PMH / PSH: PMHx reviewed  OBJECTIVE:   BP 118/86   Pulse 89   Temp 97.7 F (36.5 C)   Ht 5' 1.5" (1.562 m)   Wt 209 lb 9.6 oz (95.1 kg)   LMP  (LMP Unknown)   SpO2 100%   BMI 38.96 kg/m   Physical Exam Vitals and nursing note reviewed.  HENT:     Head:     Comments: Neg sinus tenderness    Right Ear: External ear normal. There is impacted cerumen.     Left Ear: External ear normal. There is impacted cerumen.  Cardiovascular:     Rate and Rhythm: Normal rate and regular rhythm.     Heart sounds: Normal heart sounds. No murmur heard. Pulmonary:     Effort: Pulmonary effort is normal. No respiratory distress.     Breath sounds: Normal breath sounds. No wheezing.     ASSESSMENT/PLAN:   Allergic rhinitis Chronic recurrent. Off medications for months. I restarted and escribed Xyzal and Singulair. I started her on Flonase. F/U instruction provided.  Cerumen impaction Unsuccessful ear lavage with warm saline. She consented to wax removal with ear spatula with mild success. I recommended use of OTC debrox. F/U soon if there is no  improvement. She agreed with the plan.    Andrena Mews, MD [place]

## 2021-06-26 NOTE — Assessment & Plan Note (Signed)
Unsuccessful ear lavage with warm saline. She consented to wax removal with ear spatula with mild success. I recommended use of OTC debrox. F/U soon if there is no improvement. She agreed with the plan.

## 2021-06-26 NOTE — Assessment & Plan Note (Signed)
Chronic recurrent. Off medications for months. I restarted and escribed Xyzal and Singulair. I started her on Flonase. F/U instruction provided.

## 2021-06-29 ENCOUNTER — Ambulatory Visit: Payer: 59

## 2021-06-30 ENCOUNTER — Encounter: Payer: Self-pay | Admitting: *Deleted

## 2021-07-19 ENCOUNTER — Other Ambulatory Visit: Payer: Self-pay | Admitting: Family Medicine

## 2021-08-03 ENCOUNTER — Other Ambulatory Visit: Payer: Self-pay | Admitting: Family Medicine

## 2021-08-19 ENCOUNTER — Ambulatory Visit (INDEPENDENT_AMBULATORY_CARE_PROVIDER_SITE_OTHER): Payer: Self-pay | Admitting: Family Medicine

## 2021-08-31 ENCOUNTER — Ambulatory Visit (INDEPENDENT_AMBULATORY_CARE_PROVIDER_SITE_OTHER): Payer: 59 | Admitting: Student

## 2021-08-31 VITALS — BP 114/64 | HR 91 | Ht 61.5 in | Wt 215.0 lb

## 2021-08-31 DIAGNOSIS — Z0000 Encounter for general adult medical examination without abnormal findings: Secondary | ICD-10-CM | POA: Diagnosis not present

## 2021-08-31 DIAGNOSIS — R7303 Prediabetes: Secondary | ICD-10-CM

## 2021-08-31 DIAGNOSIS — R600 Localized edema: Secondary | ICD-10-CM

## 2021-08-31 NOTE — Assessment & Plan Note (Signed)
The most likely diagnosis is venous insufficiency given the following findings: no further symptomatology, increased weight with limited activity. Previously performed lab testing 7 months which were grossly unremarkable. I will test for anemia, renal failure with CBC, BMP.   I have considered and concluded the patient has a very low likelihood of having DVT, PE, Sleep Apnea, or Pelvic Tumor. Patient is breathing well on RA and is hemodynamically stable. Encouraged using compression stockings and elevating legs with utilization of DASH diet if able with information provided via AVS.

## 2021-08-31 NOTE — Progress Notes (Signed)
  SUBJECTIVE:   CHIEF COMPLAINT / HPI:   She was seen on 02/05/21 for bilateral lower extremity edema. CMP, CBC, UA unremarkable at that time. Elevated ferritin. States it improved but the swelling in her legs have returned since Thursday. It is both legs and swells up to her middle lower leg. It appeared that way when she woke up. 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 She does wear compression stockings every day.   PERTINENT  PMH / PSH: iron deficiency anemia, prediabetes, neck pain  OBJECTIVE:  BP 114/64   Pulse 91   Ht 5' 1.5" (1.562 m)   Wt 215 lb (97.5 kg)   SpO2 100%   BMI 39.97 kg/m   General: NAD, pleasant, able to participate in exam Cardiac: RRR, no murmurs auscultated Respiratory: CTAB, normal WOB Abdomen: soft, non-distended, normoactive bowel sounds Extremities: warm and well perfused, +1 pitting edema of BLEs up to middle of lower leg, no swelling of feet or around ankles Skin: warm and dry, no rashes noted, no varicosities appreciated Psych: Normal affect and mood  ASSESSMENT/PLAN:  Bilateral lower extremity edema The most likely diagnosis is venous insufficiency given the following findings: no further symptomatology, increased weight with limited activity. Previously performed lab testing 7 months which were grossly unremarkable. I will test for anemia, renal failure with CBC, BMP.   I have considered and concluded the patient has a very low likelihood of having DVT, PE, Sleep Apnea, or Pelvic Tumor. Patient is breathing well on RA and is hemodynamically stable. Encouraged using compression stockings and elevating legs with utilization of DASH diet if able with information provided via AVS.  Prediabetes Previous A1c on 11/04/20 of 6.2. Continues to have weight gain and at risk for pushing into diabetic range. Recheck today.  Healthcare maintenance Would benefit from follow up with PCP regarding care gaps. Due for colonoscopy, mammogram, and  I suspect will need follow up with prediabetes. Advised appointment for annual physical.  Return in about 4 weeks (around 09/28/2021) for annual physical. Wells Guiles, DO 09/01/2021, 10:54 AM PGY-2, [place]

## 2021-08-31 NOTE — Patient Instructions (Signed)
It was great to see you today! Thank you for choosing [facility] for your primary care. Rebecca Olson was seen for lower extremity swelling.  Today we addressed:  Continuing with labs today to check you for anemia and kidney function. In the meantime, use compression stockings and elevate the legs.   Trying the dash diet  The DASH diet is rich in vegetables, fruits and whole grains. It includes fat-free or low-fat dairy products, fish, poultry, beans and nuts. It limits foods that are high in saturated fat, such as fatty meats and full-fat dairy products. The DASH diet also limits salt, also called sodium, to between 1,500 and 2,300 milligrams a day. Here are the number of servings to eat from each food group for two calorie levels of the DASH diet. Following are examples of what a single serving is. Recommended number of servings Food group 1,600-calorie diet 2,000-calorie diet  Grains (mainly whole grains) 6 a day 6-8 a day  Vegetables 3-4 a day 4-5 a day  Fruits 4 a day 4-5 a day  Low-fat or fat-free milk and milk products 2-3 a day 2-3 a day  Lean meats, poultry and fish 3-4 one-ounce servings or fewer a day 6 one-ounce servings or fewer a day  Nuts, seeds and legumes 3-4 a week 4-5 a week  Fats and oils 2 a day 2-3 a day  Sweets and added sugars 3 or fewer a week 5 or fewer a week  Source: National Heart, Lung, and [place]   If you haven't already, sign up for My Chart to have easy access to your labs results, and communication with your primary care physician.  We are checking some labs today. If they are abnormal, I will call you. If they are normal, I will send you a MyChart message (if it is active) or a letter in the mail. If you do not hear about your labs in the next 2 weeks, please call the office.   You should return to our clinic Return in about 4 weeks (around 09/28/2021) for annual physical.  I recommend that you always bring your medications to each  appointment as this makes it easy to ensure you are on the correct medications and helps [place] not miss refills when you need them.  Please arrive 15 minutes before your appointment to ensure smooth check in process.  We appreciate your efforts in making this happen.  Please call the clinic at [phone] if your symptoms worsen or you have any concerns.  Thank you for allowing me to participate in your care, Wells Guiles, DO 08/31/2021, 2:35 PM PGY-2, [place]

## 2021-09-01 LAB — BASIC METABOLIC PANEL
BUN/Creatinine Ratio: 18 (ref 9–23)
BUN: 15 mg/dL (ref 6–24)
CO2: 23 mmol/L (ref 20–29)
Calcium: 9.5 mg/dL (ref 8.7–10.2)
Chloride: 103 mmol/L (ref 96–106)
Creatinine, Ser: 0.84 mg/dL (ref 0.57–1.00)
Glucose: 143 mg/dL — ABNORMAL HIGH (ref 70–99)
Potassium: 4.1 mmol/L (ref 3.5–5.2)
Sodium: 138 mmol/L (ref 134–144)
eGFR: 84 mL/min/{1.73_m2} (ref >59)

## 2021-09-01 LAB — CBC
Hematocrit: 34.4 % (ref 34.0–46.6)
Hemoglobin: 11.4 g/dL (ref 11.1–15.9)
MCH: 23.1 pg — ABNORMAL LOW (ref 26.6–33.0)
MCHC: 33.1 g/dL (ref 31.5–35.7)
MCV: 70 fL — ABNORMAL LOW (ref 79–97)
Platelets: 305 10*3/uL (ref 150–450)
RBC: 4.94 x10E6/uL (ref 3.77–5.28)
RDW: 15.7 % — ABNORMAL HIGH (ref 11.7–15.4)
WBC: 10 10*3/uL (ref 3.4–10.8)

## 2021-09-01 LAB — HEMOGLOBIN A1C
Est. average glucose Bld gHb Est-mCnc: 163 mg/dL
Hgb A1c MFr Bld: 7.3 % — ABNORMAL HIGH (ref 4.8–5.6)

## 2021-09-01 NOTE — Assessment & Plan Note (Signed)
Would benefit from follow up with PCP regarding care gaps. Due for colonoscopy, mammogram, and I suspect will need follow up with prediabetes. Advised appointment for annual physical.

## 2021-09-01 NOTE — Assessment & Plan Note (Signed)
Previous A1c on 11/04/20 of 6.2. Continues to have weight gain and at risk for pushing into diabetic range. Recheck today.

## 2021-09-02 ENCOUNTER — Ambulatory Visit (INDEPENDENT_AMBULATORY_CARE_PROVIDER_SITE_OTHER): Payer: Self-pay | Admitting: Family Medicine

## 2021-09-02 ENCOUNTER — Encounter (INDEPENDENT_AMBULATORY_CARE_PROVIDER_SITE_OTHER): Payer: Self-pay

## 2021-09-07 ENCOUNTER — Encounter: Payer: Self-pay | Admitting: Student

## 2021-09-08 ENCOUNTER — Telehealth: Payer: Self-pay | Admitting: Student

## 2021-09-08 NOTE — Telephone Encounter (Signed)
Attempted to contact patient regarding new diagnosis of diabetes. Left HIPAA-compliant VM.

## 2021-09-10 NOTE — Telephone Encounter (Signed)
Patient returns call to nurse line.  Patient reports she is fine with provider leaving a detailed VM if she does not answer again.   Confirmed FU apt on 9/7.

## 2021-09-15 NOTE — Telephone Encounter (Signed)
Called patient, unable to reach. Left VM stating that her A1c puts her in diabetic range. I would recommend medical intervention as her A1c is >7.0. Medications exist in various forms via injectables, orals (such as Metformin). It would be appropriate to wait until her visit with Dr. Joelyn Oms on 10/01/21 to discuss and start medications but if she would like to call back and give a time that she would like to discuss over the phone further, I'd be happy to give another attempt at discussing over the phone. Either way, I do recommend medical intervention for her newly diagnosed diabetes.

## 2021-09-15 NOTE — Telephone Encounter (Signed)
Patient returns call to nurse line.   Patient advised on provider VM.   Patient plans to FU on 9/7 to discuss diabetic management.

## 2021-10-01 ENCOUNTER — Encounter: Payer: Self-pay | Admitting: Student

## 2021-10-01 ENCOUNTER — Ambulatory Visit (INDEPENDENT_AMBULATORY_CARE_PROVIDER_SITE_OTHER): Payer: 59 | Admitting: Student

## 2021-10-01 VITALS — BP 124/75 | HR 83

## 2021-10-01 DIAGNOSIS — Z1211 Encounter for screening for malignant neoplasm of colon: Secondary | ICD-10-CM | POA: Diagnosis not present

## 2021-10-01 DIAGNOSIS — Z0000 Encounter for general adult medical examination without abnormal findings: Secondary | ICD-10-CM

## 2021-10-01 DIAGNOSIS — Z1231 Encounter for screening mammogram for malignant neoplasm of breast: Secondary | ICD-10-CM

## 2021-10-01 DIAGNOSIS — R600 Localized edema: Secondary | ICD-10-CM | POA: Diagnosis not present

## 2021-10-01 DIAGNOSIS — E119 Type 2 diabetes mellitus without complications: Secondary | ICD-10-CM

## 2021-10-01 DIAGNOSIS — Z8639 Personal history of other endocrine, nutritional and metabolic disease: Secondary | ICD-10-CM

## 2021-10-01 MED ORDER — ROSUVASTATIN CALCIUM 20 MG PO TABS: 20.0000 mg | ORAL_TABLET | Freq: Every day | ORAL | 3 refills | Status: DC

## 2021-10-01 MED ORDER — METFORMIN HCL ER 500 MG PO TB24: 500.0000 mg | ORAL_TABLET | Freq: Every day | ORAL | 0 refills | Status: DC

## 2021-10-01 NOTE — Assessment & Plan Note (Signed)
Unlikely to be contributing to her lower extremity edema in the absence of anemia.  However, we will repeat iron studies today.  Patient is not currently taking any iron supplementation.

## 2021-10-01 NOTE — Assessment & Plan Note (Signed)
Overall reassuring exam.  Doubt cirrhosis or nephrosis based on recent lab work.  Does not seem that her heart has ever been worked up for potential heart failure, though less likely given no shortness of breath or history of hypertension.  Bedside echo without evidence of markedly reduced EF. -Will obtain BNP -Continue compression stockings -Follow-up in 3 months

## 2021-10-01 NOTE — Assessment & Plan Note (Signed)
New diagnosis today.  Patient amenable to starting metformin and Crestor. Patient to pursue lifestyle changes as well, is a member of the wife.  Will increase cardio and start resistance training. -Follow-up in 3 months for repeat A1c -Patient to self titrate metformin dose

## 2021-10-01 NOTE — Patient Instructions (Signed)
Rebecca Olson,  It is such a joy to meet you!  I am going to enjoy being your doctor.  It is a recap of what we talked about: On our bedside examination of your heart, everything looked normal.  However, I think that it is worth checking at least 1 lab to make sure that your heart is not contributing to your swelling at this time.  Most likely this is all just due to you being on your feet quite a bit at your job. Your A1c was elevated last time that we checked it, therefore I think it is worth [place] starting you on a medication called metformin which can help you lower your blood sugar and can also help with a little bit of weight loss.  I am starting you with 500 mg once daily.  If you find that this dose does not cause any side effects, you can increase to taking this twice daily without issue. I am also starting you on a medication called Crestor or rosuvastatin, this medication is known to save lives in patients with an elevated A1c.  It will also help to lower your cholesterol. I have also ordered a mammogram.  You can call the breast center at [phone] to schedule this at your convenience. I have also ordered a colonoscopy for you.  The gastroenterology office will call you in the next few weeks to schedule this.  Give them several weeks to call. I would like to see you back in about 3 months.  Pearla Dubonnet, MD

## 2021-10-01 NOTE — Progress Notes (Signed)
    SUBJECTIVE:   CHIEF COMPLAINT / HPI:   LE Edema Present since January.  Previously attributed to venous stasis, however patient notes no overlying skin changes. She denies any swelling in her belly or shortness of breath. She is able to lie flat without issue.  She notes that she is on her feet nearly nonstop at work.  She does not have a history of known blood clots.  No history of varicose veins either.  No family history of peripheral vascular disease.  New Diabetes Patient has questions today about her newly elevated A1c on her most recent lab work.  A1c was 7.3%.  Patient feels motivated to engage in lifestyle changes to improve this.  She is a [facility] at [employer] and has been trying to increase her cardio.  She is also amenable to starting a resistance training regimen.  She is not currently on any antidiabetic medications.  She is also not on a statin.  History of Iron Deficiency Patient notes a history of iron deficiency. Wonders if this is reflected in her recent labwork. Note normal hemoglobin but MCV 70.   Healthcare Maintenance Patient is overdue for colonoscopy and mammogram.  Amenable to ordering both of these today.   OBJECTIVE:   BP 124/75   Pulse 83   SpO2 97%   Physical Exam Vitals reviewed.  Constitutional:      General: She is not in acute distress. Cardiovascular:     Rate and Rhythm: Normal rate.     Heart sounds: No murmur heard. Pulmonary:     Effort: Pulmonary effort is normal. No respiratory distress.     Breath sounds: Normal breath sounds.  Abdominal:     General: Abdomen is flat.     Palpations: Abdomen is soft.  Musculoskeletal:     Comments: Bilateral lower extremities with 1+ pitting edema to the upper shin, no overlying skin changes, no varicose veins.  Distal pulses 2+ bilaterally.  Skin:    General: Skin is warm and dry.      ASSESSMENT/PLAN:   Type 2 diabetes mellitus without complications ([place]) New diagnosis today.  Patient amenable  to starting metformin and Crestor. Patient to pursue lifestyle changes as well, is a member of the wife.  Will increase cardio and start resistance training. -Follow-up in 3 months for repeat A1c -Patient to self titrate metformin dose  History of iron deficiency Unlikely to be contributing to her lower extremity edema in the absence of anemia.  However, we will repeat iron studies today.  Patient is not currently taking any iron supplementation.  Healthcare maintenance Colonoscopy and mammogram ordered.  Patient to follow-up in 3 months.  Bilateral lower extremity edema Overall reassuring exam.  Doubt cirrhosis or nephrosis based on recent lab work.  Does not seem that her heart has ever been worked up for potential heart failure, though less likely given no shortness of breath or history of hypertension.  Bedside echo without evidence of markedly reduced EF. -Will obtain BNP -Continue compression stockings -Follow-up in 3 months     Pearla Dubonnet, MD [place]

## 2021-10-01 NOTE — Assessment & Plan Note (Signed)
Colonoscopy and mammogram ordered.  Patient to follow-up in 3 months.

## 2021-10-02 LAB — ANEMIA PROFILE B
Basophils Absolute: 0.1 10*3/uL (ref 0.0–0.2)
Basos: 1 %
EOS (ABSOLUTE): 0.1 10*3/uL (ref 0.0–0.4)
Eos: 1 %
Ferritin: 146 ng/mL (ref 15–150)
Folate: 10.3 ng/mL (ref >3.0)
Hematocrit: 37.8 % (ref 34.0–46.6)
Hemoglobin: 12.3 g/dL (ref 11.1–15.9)
Immature Grans (Abs): 0 10*3/uL (ref 0.0–0.1)
Immature Granulocytes: 0 %
Iron Saturation: 18 % (ref 15–55)
Iron: 56 ug/dL (ref 27–159)
Lymphocytes Absolute: 3.9 10*3/uL — ABNORMAL HIGH (ref 0.7–3.1)
Lymphs: 41 %
MCH: 23.2 pg — ABNORMAL LOW (ref 26.6–33.0)
MCHC: 32.5 g/dL (ref 31.5–35.7)
MCV: 71 fL — ABNORMAL LOW (ref 79–97)
Monocytes Absolute: 0.5 10*3/uL (ref 0.1–0.9)
Monocytes: 6 %
Neutrophils Absolute: 4.9 10*3/uL (ref 1.4–7.0)
Neutrophils: 51 %
Platelets: 341 10*3/uL (ref 150–450)
RBC: 5.3 x10E6/uL — ABNORMAL HIGH (ref 3.77–5.28)
RDW: 17 % — ABNORMAL HIGH (ref 11.7–15.4)
Retic Ct Pct: 1.4 % (ref 0.6–2.6)
Total Iron Binding Capacity: 309 ug/dL (ref 250–450)
UIBC: 253 ug/dL (ref 131–425)
Vitamin B-12: 678 pg/mL (ref 232–1245)
WBC: 9.5 10*3/uL (ref 3.4–10.8)

## 2021-10-02 LAB — BRAIN NATRIURETIC PEPTIDE: BNP: 4 pg/mL (ref 0.0–100.0)

## 2021-11-04 ENCOUNTER — Encounter: Payer: Self-pay | Admitting: Gastroenterology

## 2021-11-26 ENCOUNTER — Telehealth: Payer: Self-pay | Admitting: Student

## 2021-11-26 ENCOUNTER — Other Ambulatory Visit (HOSPITAL_COMMUNITY): Payer: Self-pay

## 2021-11-26 ENCOUNTER — Encounter: Payer: Self-pay | Admitting: Student

## 2021-11-26 DIAGNOSIS — E119 Type 2 diabetes mellitus without complications: Secondary | ICD-10-CM

## 2021-11-26 MED ORDER — METFORMIN HCL ER 500 MG PO TB24
500.0000 mg | ORAL_TABLET | Freq: Every day | ORAL | 0 refills | Status: AC
  Filled 2021-11-26: qty 30, 30d supply, fill #0

## 2021-11-26 MED ORDER — ROSUVASTATIN CALCIUM 20 MG PO TABS
20.0000 mg | ORAL_TABLET | Freq: Every day | ORAL | 3 refills | Status: AC
  Filled 2021-11-26: qty 30, 30d supply, fill #0

## 2021-11-26 NOTE — Telephone Encounter (Signed)
Patient stopped by today needs  her  medicine to be sent to [place]. She was seen by PCP on 09/30/21.  Name of meds she needs Is Metformin 500 mg (glucophage xr) and Crestor.  Any questions call patient.  She never picked up her meds that where sent to [facility].

## 2021-12-28 ENCOUNTER — Encounter: Payer: Self-pay | Admitting: Gastroenterology

## 2021-12-29 ENCOUNTER — Other Ambulatory Visit: Payer: Self-pay | Admitting: Family Medicine

## 2021-12-29 DIAGNOSIS — J3089 Other allergic rhinitis: Secondary | ICD-10-CM

## 2022-01-04 ENCOUNTER — Ambulatory Visit (AMBULATORY_SURGERY_CENTER): Payer: Self-pay

## 2022-01-04 ENCOUNTER — Other Ambulatory Visit (HOSPITAL_COMMUNITY): Payer: Self-pay

## 2022-01-04 VITALS — Ht 61.0 in | Wt 210.0 lb

## 2022-01-04 DIAGNOSIS — Z1211 Encounter for screening for malignant neoplasm of colon: Secondary | ICD-10-CM

## 2022-01-04 MED ORDER — NA SULFATE-K SULFATE-MG SULF 17.5-3.13-1.6 GM/177ML PO SOLN: 1.0000 | Freq: Once | ORAL | 0 refills | Status: AC

## 2022-01-04 MED ORDER — NA SULFATE-K SULFATE-MG SULF 17.5-3.13-1.6 GM/177ML PO SOLN
1.0000 | Freq: Once | ORAL | 0 refills | Status: DC
  Filled 2022-01-04: qty 354, 1d supply, fill #0

## 2022-01-04 NOTE — Addendum Note (Signed)
Addended by: Richarda Blade on: 01/04/2022 02:14 PM   Modules accepted: Orders

## 2022-01-04 NOTE — Progress Notes (Signed)

## 2022-01-22 ENCOUNTER — Ambulatory Visit (INDEPENDENT_AMBULATORY_CARE_PROVIDER_SITE_OTHER): Payer: Self-pay | Admitting: Family Medicine

## 2022-01-22 ENCOUNTER — Encounter: Payer: Self-pay | Admitting: Family Medicine

## 2022-01-22 VITALS — BP 126/85 | HR 91 | Wt 217.2 lb

## 2022-01-22 DIAGNOSIS — T148XXA Other injury of unspecified body region, initial encounter: Secondary | ICD-10-CM

## 2022-01-22 DIAGNOSIS — M542 Cervicalgia: Secondary | ICD-10-CM

## 2022-01-22 MED ORDER — TIZANIDINE HCL 4 MG PO TABS: 4.0000 mg | ORAL_TABLET | Freq: Three times a day (TID) | ORAL | 0 refills | Status: AC | PRN

## 2022-01-22 NOTE — Patient Instructions (Addendum)
It was great seeing you today!  Today we discussed your neck pain, this seems to be due to muscle strain coming form your back causing neck pain and headaches. I have refilled your muscle relaxer. I have attached some stretching exercises that can help with relieving your neck pain which can help your headaches. Applying a heating pad can help along with massaging the area.   Please follow up at your next scheduled appointment, if anything arises between now and then, please don't hesitate to contact our office.   Thank you for allowing [place] to be a part of your medical care!  Thank you, Dr. Larae Grooms  Also a reminder of our clinic's no-show policy. Please make sure to arrive at least 15 minutes prior to your scheduled appointment time. Please try to cancel before 24 hours if you are not able to make it. If you no-show for 2 appointments then you will be receiving a warning letter. If you no-show after 3 visits, then you may be at risk of being dismissed from our clinic. This is to ensure that everyone is able to be seen in a timely manner. Thank you, we appreciate your assistance with this!

## 2022-01-22 NOTE — Progress Notes (Signed)
    SUBJECTIVE:   CHIEF COMPLAINT / HPI:   Patient presents for neck pain which she has been seen for in the past. Says that it is hurting again. Feels like a soreness, these symptoms restarted 2 weeks ago. She has been prescribed muscles relaxers in the past which have helped tremendously. Says that the beck pain sometimes radiates down her upper back. Also endorses a headache when asked that primarily aches along her forehand in a band-like distribution. 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 Denies other symptoms.   OBJECTIVE:   BP 126/85   Pulse 91   Wt 217 lb 4 oz (98.5 kg)   SpO2 98%   BMI 41.05 kg/m   General: Patient well-appearing, in no acute distress. HEENT: EOMI without pain elicited  CV: RRR, no murmurs or gallops auscultated Resp: CTAB, no wheezing, rales or rhonchi noted MSK: no gross deformity of shoulders bilaterally, no tenderness upon palpation, full active ROM of both shoulders and elbows, tight tissue texture changes noted along right trapezius and sternocleidomastoid muscles noted Neuro: CN 2-12 grossly intact, 5/5 UE and LE strength bilaterally, gross sensation intact, normal gait  Psych: mood appropriate, pleasant   ASSESSMENT/PLAN:   Muscle strain -muscle strain and tightness of both right trapezius and sternocleidomastoid muscles, I explained that this is what is causing her tension headaches. Also considered fibromyalgia but low suspicion given lack of tender points  -encouraged ways to relieve stress -conservative measures discussed including use of heating pad and massager.  -stretching exercises provided and demonstrated  -tinazidine refill provided, of note patient has used her 30 tablet bottle over a period of 10 months  -reassurance provided  -follow up as appropriate    -PHQ-9 score of 2 with negative question 9  reviewed.   Donney Dice, [place]

## 2022-01-24 DIAGNOSIS — T148XXA Other injury of unspecified body region, initial encounter: Secondary | ICD-10-CM | POA: Insufficient documentation

## 2022-01-24 NOTE — Assessment & Plan Note (Signed)
-  muscle strain and tightness of both right trapezius and sternocleidomastoid muscles, I explained that this is what is causing her tension headaches. Also considered fibromyalgia but low suspicion given lack of tender points  -encouraged ways to relieve stress -conservative measures discussed including use of heating pad and massager.  -stretching exercises provided and demonstrated  -tinazidine refill provided, of note patient has used her 30 tablet bottle over a period of 10 months  -reassurance provided  -follow up as appropriate

## 2022-02-01 ENCOUNTER — Encounter: Payer: Self-pay | Admitting: Gastroenterology

## 2022-02-02 ENCOUNTER — Encounter: Payer: Self-pay | Admitting: Certified Registered Nurse Anesthetist

## 2022-02-02 ENCOUNTER — Other Ambulatory Visit (HOSPITAL_COMMUNITY): Payer: Self-pay

## 2022-02-02 MED ORDER — WEGOVY 0.25 MG/0.5ML ~~LOC~~ SOAJ
0.5000 mL | SUBCUTANEOUS | 0 refills | Status: AC
  Filled 2022-02-02: qty 2, 28d supply, fill #0

## 2022-02-03 ENCOUNTER — Encounter: Payer: Self-pay | Admitting: Gastroenterology

## 2022-02-03 ENCOUNTER — Ambulatory Visit: Payer: BLUE CROSS/BLUE SHIELD | Admitting: Gastroenterology

## 2022-02-03 VITALS — BP 113/83 | HR 78 | Temp 96.0°F | Resp 13 | Ht 61.5 in | Wt 210.0 lb

## 2022-02-03 DIAGNOSIS — Z1211 Encounter for screening for malignant neoplasm of colon: Secondary | ICD-10-CM

## 2022-02-03 DIAGNOSIS — D125 Benign neoplasm of sigmoid colon: Secondary | ICD-10-CM

## 2022-02-03 DIAGNOSIS — D123 Benign neoplasm of transverse colon: Secondary | ICD-10-CM

## 2022-02-03 DIAGNOSIS — D122 Benign neoplasm of ascending colon: Secondary | ICD-10-CM | POA: Diagnosis not present

## 2022-02-03 MED ORDER — SODIUM CHLORIDE 0.9 % IV SOLN: 500.0000 mL | Freq: Once | INTRAVENOUS | Status: DC

## 2022-02-03 NOTE — Patient Instructions (Signed)
Discharge instructions given. Handout on polyps. Resume previous medications. YOU HAD AN ENDOSCOPIC PROCEDURE TODAY AT THE [facility]:   Refer to the procedure report that was given to you for any specific questions about what was found during the examination.  If the procedure report does not answer your questions, please call your gastroenterologist to clarify.  If you requested that your care partner not be given the details of your procedure findings, then the procedure report has been included in a sealed envelope for you to review at your convenience later.  YOU SHOULD EXPECT: Some feelings of bloating in the abdomen. Passage of more gas than usual.  Walking can help get rid of the air that was put into your GI tract during the procedure and reduce the bloating. If you had a lower endoscopy (such as a colonoscopy or flexible sigmoidoscopy) you may notice spotting of blood in your stool or on the toilet paper. If you underwent a bowel prep for your procedure, you may not have a normal bowel movement for a few days.  Please Note:  You might notice some irritation and congestion in your nose or some drainage.  This is from the oxygen used during your procedure.  There is no need for concern and it should clear up in a day or so.  SYMPTOMS TO REPORT IMMEDIATELY:  Following lower endoscopy (colonoscopy or flexible sigmoidoscopy):  Excessive amounts of blood in the stool  Significant tenderness or worsening of abdominal pains  Swelling of the abdomen that is new, acute  Fever of 100F or higher   For urgent or emergent issues, a gastroenterologist can be reached at any hour by calling [phone]. Do not use MyChart messaging for urgent concerns.    DIET:  We do recommend a small meal at first, but then you may proceed to your regular diet.  Drink plenty of fluids but you should avoid alcoholic beverages for 24 hours.  ACTIVITY:  You should plan to take it easy for the rest  of today and you should NOT DRIVE or use heavy machinery until tomorrow (because of the sedation medicines used during the test).    FOLLOW UP: Our staff will call the number listed on your records the next business day following your procedure.  We will call around 7:15- 8:00 am to check on you and address any questions or concerns that you may have regarding the information given to you following your procedure. If we do not reach you, we will leave a message.     If any biopsies were taken you will be contacted by phone or by letter within the next 1-3 weeks.  Please call us at [phone] if you have not heard about the biopsies in 3 weeks.    SIGNATURES/CONFIDENTIALITY: You and/or your care partner have signed paperwork which will be entered into your electronic medical record.  These signatures attest to the fact that that the information above on your After Visit Summary has been reviewed and is understood.  Full responsibility of the confidentiality of this discharge information lies with you and/or your care-partner. 

## 2022-02-03 NOTE — Progress Notes (Signed)
Called to room to assist during endoscopic procedure.  Patient ID and intended procedure confirmed with present staff. Received instructions for my participation in the procedure from the performing physician.  

## 2022-02-03 NOTE — Progress Notes (Signed)
Pt's states no medical or surgical changes since previsit or office visit. 

## 2022-02-03 NOTE — Progress Notes (Signed)
Report given to PACU, vss 

## 2022-02-03 NOTE — Op Note (Signed)
[facility] Patient Name: Rebecca Olson Procedure Date: 02/03/2022 1:54 PM MRN: [id number] Endoscopist: Remo Lipps P. Havery Moros , MD, [id number] Age: 52 Referring MD:  Date of Birth: [birth year] Gender: Female Account #: [id number] Procedure:                Colonoscopy Indications:              Screening for colorectal malignant neoplasm, This                            is the patient's first colonoscopy Medicines:                Monitored Anesthesia Care Procedure:                Pre-Anesthesia Assessment:                           - Prior to the procedure, a History and Physical                            was performed, and patient medications and                            allergies were reviewed. The patient's tolerance of                            previous anesthesia was also reviewed. The risks                            and benefits of the procedure and the sedation                            options and risks were discussed with the patient.                            All questions were answered, and informed consent                            was obtained. Prior Anticoagulants: The patient has                            taken no anticoagulant or antiplatelet agents. ASA                            Grade Assessment: II - A patient with mild systemic                            disease. After reviewing the risks and benefits,                            the patient was deemed in satisfactory condition to                            undergo the procedure.  After obtaining informed consent, the colonoscope                            was passed under direct vision. Throughout the                            procedure, the patient's blood pressure, pulse, and                            oxygen saturations were monitored continuously. The                            PCF-HQ190L Colonoscope was introduced through the                            anus and advanced  to the the cecum, identified by                            appendiceal orifice and ileocecal valve. The                            colonoscopy was performed without difficulty. The                            patient tolerated the procedure well. The quality                            of the bowel preparation was good. The ileocecal                            valve, appendiceal orifice, and rectum were                            photographed. Scope In: 2:17:22 PM Scope Out: 2:32:11 PM Scope Withdrawal Time: 0 hours 13 minutes 28 seconds  Total Procedure Duration: 0 hours 14 minutes 49 seconds  Findings:                 The perianal and digital rectal examinations were                            normal.                           A diminutive polyp was found in the ascending                            colon. The polyp was sessile. The polyp was removed                            with a cold snare. Resection and retrieval were                            complete.  A diminutive polyp was found in the transverse                            colon. The polyp was flat. The polyp was removed                            with a cold snare. Resection and retrieval were                            complete.                           A 3 mm polyp was found in the sigmoid colon. The                            polyp was sessile. The polyp was removed with a                            cold snare. Resection and retrieval were complete.                           Anal papilla(e) were hypertrophied.                           The exam was otherwise without abnormality. Complications:            No immediate complications. Estimated blood loss:                            Minimal. Estimated Blood Loss:     Estimated blood loss was minimal. Impression:               - One diminutive polyp in the ascending colon,                            removed with a cold snare. Resected and retrieved.                            - One diminutive polyp in the transverse colon,                            removed with a cold snare. Resected and retrieved.                           - One 3 mm polyp in the sigmoid colon, removed with                            a cold snare. Resected and retrieved.                           - Anal papilla(e) were hypertrophied.                           - The examination was otherwise normal. Recommendation:           -  Patient has a contact number available for                            emergencies. The signs and symptoms of potential                            delayed complications were discussed with the                            patient. Return to normal activities tomorrow.                            Written discharge instructions were provided to the                            patient.                           - Resume previous diet.                           - Continue present medications.                           - Await pathology results. Remo Lipps P. Samon Dishner, MD 02/03/2022 2:37:27 PM This report has been signed electronically.

## 2022-02-03 NOTE — Progress Notes (Signed)
[facility] History and Physical   Primary Care Physician:  Rebecca Olson   Reason for Procedure:   Colon cancer screening  Plan:    colonoscopy     HPI: Rebecca Olson is a 52 y.o. female  here for colonoscopy screening - first time exam   Patient denies any bowel symptoms at this time. No family history of colon cancer known. Otherwise feels well without any cardiopulmonary symptoms.   I have discussed risks / benefits of anesthesia and endoscopic procedure with Rebecca Olson and they wish to proceed with the exams as outlined today.    Past Medical History:  Diagnosis Date   Allergy    Asthma    Constipation    Diabetes mellitus without complication ([place])    Hayfever    Sinus complaint     Past Surgical History:  Procedure Laterality Date   CARPAL TUNNEL WITH CUBITAL TUNNEL  2011    Prior to Admission medications   Medication Sig Start Date End Date Taking? Authorizing Provider  levocetirizine (XYZAL) 5 MG tablet Take 1 tablet (5 mg total) by mouth every evening. 06/26/21  Yes Rebecca Feil, Olson  metFORMIN (GLUCOPHAGE-XR) 500 MG 24 hr tablet Take 1 tablet (500 mg total) by mouth daily with breakfast. 11/26/21  Yes Rebecca Olson  tiZANidine (ZANAFLEX) 4 MG tablet Take 1 tablet (4 mg total) by mouth every 8 (eight) hours as needed for muscle spasms. 01/22/22  Yes Olson, Anupa, DO  albuterol (VENTOLIN HFA) 108 (90 Base) MCG/ACT inhaler Inhale 2 puffs into the lungs every 6 (six) hours as needed for wheezing or shortness of breath. Patient not taking: Reported on 06/26/2021 06/23/21   Rebecca Olson  ferrous sulfate 325 (65 FE) MG tablet TAKE 1 TABLET BY MOUTH EVERY DAY WITH BREAKFAST Patient not taking: Reported on 03/18/2021 11/18/20   Rebecca Olson  fluocinonide ointment (LIDEX) 0.05 % Apply topically. Patient not taking: Reported on 01/04/2022 11/22/19   Provider, Historical, Olson  fluticasone (FLONASE) 50 MCG/ACT nasal spray SPRAY 2 SPRAYS  INTO EACH NOSTRIL EVERY DAY 08/04/21 11/02/21  Rebecca Olson  ibuprofen (ADVIL) 800 MG tablet Take 1 tablet (800 mg total) by mouth every 8 (eight) hours as needed (pain). Patient not taking: Reported on 01/04/2022 02/27/21   Rebecca Henle, Olson  montelukast (SINGULAIR) 10 MG tablet TAKE 1 TABLET BY MOUTH EVERYDAY AT BEDTIME 12/29/21   Rebecca Olson  rosuvastatin (CRESTOR) 20 MG tablet Take 1 tablet (20 mg total) by mouth daily. 11/26/21   Rebecca Olson  Semaglutide-Weight Management (WEGOVY) 0.25 MG/0.5ML SOAJ Inject 0.25 mg into the skin once a week. Patient not taking: Reported on 02/03/2022 02/02/22       Current Outpatient Medications  Medication Sig Dispense Refill   levocetirizine (XYZAL) 5 MG tablet Take 1 tablet (5 mg total) by mouth every evening. 90 tablet 1   metFORMIN (GLUCOPHAGE-XR) 500 MG 24 hr tablet Take 1 tablet (500 mg total) by mouth daily with breakfast. 180 tablet 0   tiZANidine (ZANAFLEX) 4 MG tablet Take 1 tablet (4 mg total) by mouth every 8 (eight) hours as needed for muscle spasms. 30 tablet 0   albuterol (VENTOLIN HFA) 108 (90 Base) MCG/ACT inhaler Inhale 2 puffs into the lungs every 6 (six) hours as needed for wheezing or shortness of breath. (Patient not taking: Reported on 06/26/2021) 8 g 6   ferrous sulfate 325 (65 FE) MG tablet TAKE  1 TABLET BY MOUTH EVERY DAY WITH BREAKFAST (Patient not taking: Reported on 03/18/2021) 90 tablet 1   fluocinonide ointment (LIDEX) 0.05 % Apply topically. (Patient not taking: Reported on 01/04/2022)     fluticasone (FLONASE) 50 MCG/ACT nasal spray SPRAY 2 SPRAYS INTO EACH NOSTRIL EVERY DAY 48 mL 1   ibuprofen (ADVIL) 800 MG tablet Take 1 tablet (800 mg total) by mouth every 8 (eight) hours as needed (pain). (Patient not taking: Reported on 01/04/2022) 21 tablet 0   montelukast (SINGULAIR) 10 MG tablet TAKE 1 TABLET BY MOUTH EVERYDAY AT BEDTIME 90 tablet 1   rosuvastatin (CRESTOR) 20 MG tablet Take 1 tablet (20 mg total)  by mouth daily. 90 tablet 3   Semaglutide-Weight Management (WEGOVY) 0.25 MG/0.5ML SOAJ Inject 0.25 mg into the skin once a week. (Patient not taking: Reported on 02/03/2022) 2 mL 0   Current Facility-Administered Medications  Medication Dose Route Frequency Provider Last Rate Last Admin   0.9 %  sodium chloride infusion  500 mL Intravenous Once Rebecca Olson, Carlota Raspberry, Olson        Allergies as of 02/03/2022   (No Known Allergies)    Family History  Problem Relation Age of Onset   Diabetes Mother    Asthma Mother    Hypertension Mother    Allergic rhinitis Mother    Obesity Mother    Cancer Father        pancreatic ca   Healthy Brother    Angioedema Neg Hx    Atopy Neg Hx    Eczema Neg Hx    Immunodeficiency Neg Hx    Urticaria Neg Hx    Colon cancer Neg Hx    Colon polyps Neg Hx    Esophageal cancer Neg Hx    Rectal cancer Neg Hx    Stomach cancer Neg Hx     Social History   Socioeconomic History   Marital status: Single    Spouse name: Not on file   Number of children: Not on file   Years of education: Not on file   Highest education level: Not on file  Occupational History   Occupation: CNA, Rehab Tech  Tobacco Use   Smoking status: Never    Passive exposure: Yes   Smokeless tobacco: Never   Tobacco comments:    husband smokes inside the house  Vaping Use   Vaping Use: Never used  Substance and Sexual Activity   Alcohol use: No   Drug use: No   Sexual activity: Yes  Other Topics Concern   Not on file  Social History Narrative   Lives with daughter- age 23.  Husband is deployed      Works with [employer] as a Engineer, civil (consulting) Strain: Not on [employer] Insecurity: Not on file  Transportation Needs: Not on file  Physical Activity: Not on file  Stress: Not on file  Social Connections: Not on file  Intimate Partner Violence: Not on file    Review of Systems: All other review of systems  negative except as mentioned in the HPI.  Physical Exam: Vital signs BP 122/76   Pulse 82   Temp (!) 96 F (35.6 C) (Temporal)   Ht 5' 1.5" (1.562 m)   Wt 210 lb (95.3 kg)   SpO2 97%   BMI 39.04 kg/m   General:   Alert,  Well-developed, pleasant and cooperative in  NAD Lungs:  Clear throughout to auscultation.   Heart:  Regular rate and rhythm Abdomen:  Soft, nontender and nondistended.   Neuro/Psych:  Alert and cooperative. Normal mood and affect. A and O x 3  Jolly Mango, Olson [facility]

## 2022-02-04 ENCOUNTER — Telehealth: Payer: Self-pay

## 2022-02-04 NOTE — Telephone Encounter (Signed)
  Follow up Call-     02/03/2022    1:14 PM  Call back number  Post procedure Call Back phone  # [phone]  Permission to leave phone message Yes     Patient questions:  Do you have a fever, pain , or abdominal swelling? No. Pain Score  0 *  Have you tolerated food without any problems? Yes.    Have you been able to return to your normal activities? Yes.    Do you have any questions about your discharge instructions: Diet   No. Medications  No. Follow up visit  No.  Do you have questions or concerns about your Care? No.  Actions: * If pain score is 4 or above: No action needed, pain <4.

## 2023-03-11 IMAGING — MG DIGITAL SCREENING BILAT W/ CAD
5 series · 5 of 5 positions shown · non-contrast
Comparison: Previous exam(s).

CLINICAL DATA: Screening.

EXAM:
DIGITAL SCREENING BILATERAL MAMMOGRAM WITH CAD
TECHNIQUE: Bilateral screening digital craniocaudal and mediolateral oblique
mammograms were obtained. The images were evaluated with
computer-aided detection.

[L MLO]
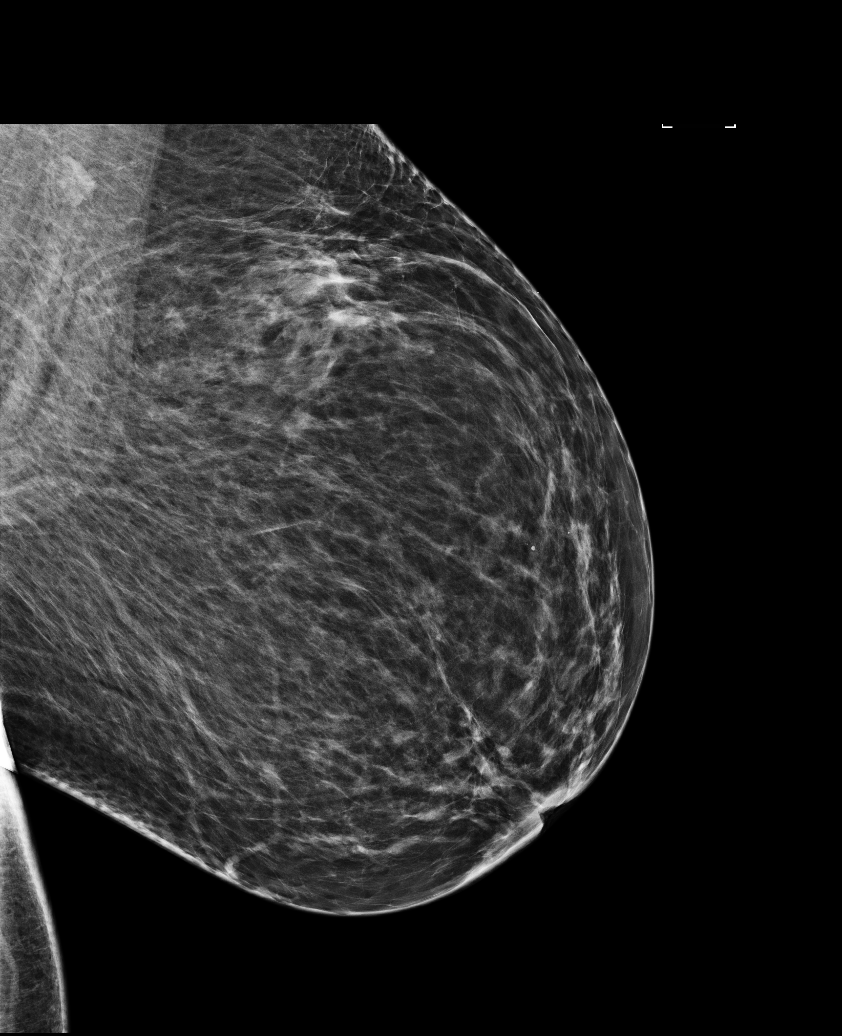

[L CC (1 of 2)]
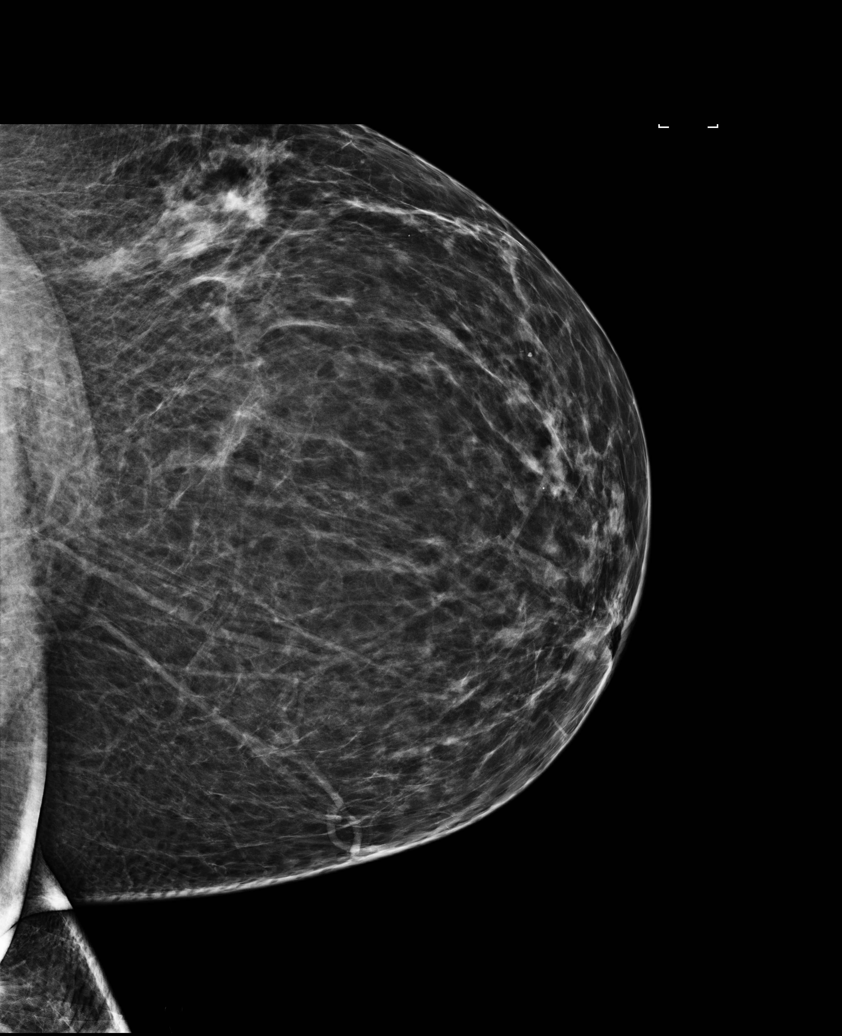

[R CC]
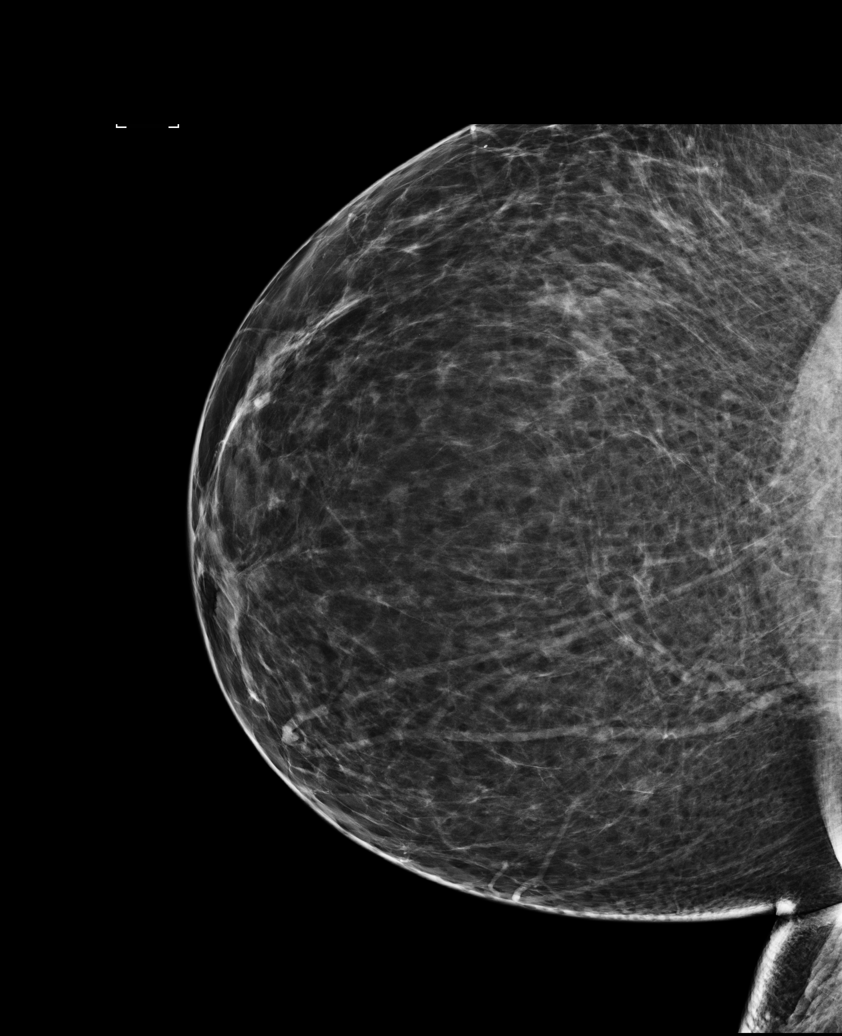

[R MLO]
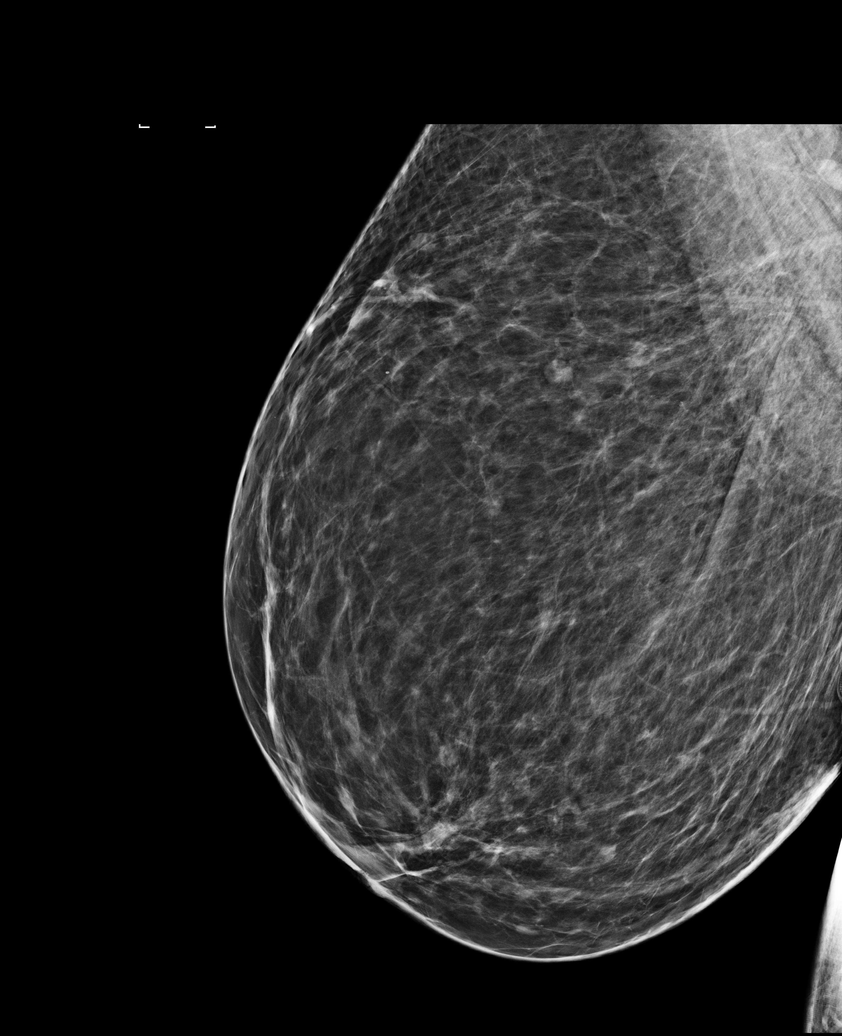

[L CC (2 of 2)]
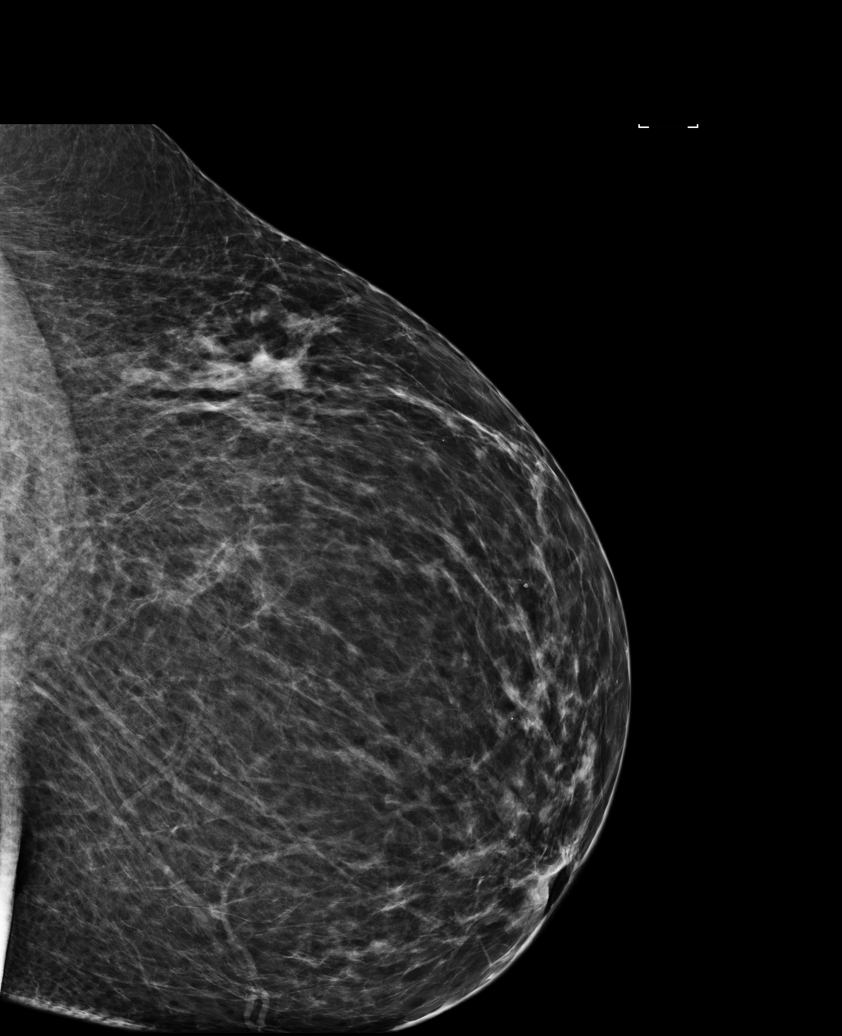

[5 of 5 positions shown; findings below may reference images not displayed]

ACR Breast Density Category b: There are scattered areas of
fibroglandular density.
FINDINGS: There are no findings suspicious for malignancy. The images were
evaluated with computer-aided detection.
IMPRESSION: No mammographic evidence of malignancy. A result letter of this
screening mammogram will be mailed directly to the patient.

RECOMMENDATION:
Screening mammogram in one year. (Code:[id number])

BI-RADS CATEGORY  1: Negative.

## 2023-06-26 HISTORY — PX: OTHER SURGICAL HISTORY: SHX169

## 2023-07-05 ENCOUNTER — Encounter: Payer: Self-pay | Admitting: *Deleted

## 2024-01-21 ENCOUNTER — Other Ambulatory Visit: Payer: Self-pay

## 2024-01-21 ENCOUNTER — Emergency Department (HOSPITAL_BASED_OUTPATIENT_CLINIC_OR_DEPARTMENT_OTHER)
Admission: EM | Admit: 2024-01-21 | Discharge: 2024-01-22 | Disposition: A | Discharge status: Home / Self Care | Payer: Self-pay | Attending: Emergency Medicine | Admitting: Emergency Medicine

## 2024-01-21 ENCOUNTER — Encounter (HOSPITAL_BASED_OUTPATIENT_CLINIC_OR_DEPARTMENT_OTHER): Payer: Self-pay | Admitting: Emergency Medicine

## 2024-01-21 DIAGNOSIS — J45901 Unspecified asthma with (acute) exacerbation: Secondary | ICD-10-CM | POA: Insufficient documentation

## 2024-01-21 DIAGNOSIS — J101 Influenza due to other identified influenza virus with other respiratory manifestations: Secondary | ICD-10-CM | POA: Insufficient documentation

## 2024-01-21 LAB — RESP PANEL BY RT-PCR (RSV, FLU A&B, COVID)  RVPGX2
Influenza A by PCR: POSITIVE — AB
Influenza B by PCR: NEGATIVE
Resp Syncytial Virus by PCR: NEGATIVE
SARS Coronavirus 2 by RT PCR: NEGATIVE

## 2024-01-21 MED ORDER — METHYLPREDNISOLONE SODIUM SUCC 125 MG IJ SOLR: 125.0000 mg | Freq: Once | INTRAMUSCULAR | Status: DC

## 2024-01-21 MED ORDER — PREDNISONE 20 MG PO TABS: 40.0000 mg | ORAL_TABLET | Freq: Every day | ORAL | 0 refills | Status: AC

## 2024-01-21 NOTE — Discharge Instructions (Addendum)
 Follow-up with your primary care doctor.  Take prednisone  as directed.  Your flu test was positive.   You are given a shot of steroid in the emergency department.  Start the prednisone  that has been prescribed to you tomorrow morning.  Keep using the albuterol  inhaler as needed.  Return for any emergent symptoms.

## 2024-01-21 NOTE — ED Triage Notes (Signed)
 Cough, body ache and sob Since Tuesday Hx asthma Not improved with neb or inhaler

## 2024-01-21 NOTE — ED Provider Notes (Signed)
 " [place] EMERGENCY DEPARTMENT AT [facility] Provider Note   CSN: [id number] Arrival date & time: 01/21/24  2146     Patient presents with: Cough   Rebecca Olson is a 53 y.o. female.   54 year old female with past medical history significant for asthma presents today for concern of malaise, shortness of breath, and cough ongoing since Tuesday.  Has been using her albuterol  inhaler daily along with her nebulizer.  Last used her nebulizer machine yesterday.  Last used her inhaler earlier today.  No chest pain.  The history is provided by the patient. No language interpreter was used.       Prior to Admission medications  Medication Sig Start Date End Date Taking? Authorizing Provider  albuterol  (VENTOLIN  HFA) 108 (90 Base) MCG/ACT inhaler Inhale 2 puffs into the lungs every 6 (six) hours as needed for wheezing or shortness of breath. Patient not taking: Reported on 06/26/2021 06/23/21   Hope Merle, MD  ferrous sulfate  325 (65 FE) MG tablet TAKE 1 TABLET BY MOUTH EVERY DAY WITH BREAKFAST Patient not taking: Reported on 03/18/2021 11/18/20   Walsh, Tanya, MD  fluocinonide ointment (LIDEX) 0.05 % Apply topically. Patient not taking: Reported on 01/04/2022 11/22/19   [provider]  fluticasone  (FLONASE ) 50 MCG/ACT nasal spray SPRAY 2 SPRAYS INTO EACH NOSTRIL EVERY DAY 08/04/21 11/02/21  Marlee Lynwood NOVAK, MD  ibuprofen  (ADVIL ) 800 MG tablet Take 1 tablet (800 mg total) by mouth every 8 (eight) hours as needed (pain). Patient not taking: Reported on 01/04/2022 02/27/21   Banister, Pamela K, MD  levocetirizine (XYZAL ) 5 MG tablet Take 1 tablet (5 mg total) by mouth every evening. 06/26/21   Anders Otto DASEN, MD  metFORMIN  (GLUCOPHAGE -XR) 500 MG 24 hr tablet Take 1 tablet (500 mg total) by mouth daily with breakfast. 11/26/21   Marlee Lynwood NOVAK, MD  montelukast  (SINGULAIR ) 10 MG tablet TAKE 1 TABLET BY MOUTH EVERYDAY AT BEDTIME 12/29/21   Marlee Lynwood NOVAK, MD  rosuvastatin   (CRESTOR ) 20 MG tablet Take 1 tablet (20 mg total) by mouth daily. 11/26/21   Marlee Lynwood NOVAK, MD  Semaglutide -Weight Management (WEGOVY ) 0.25 MG/0.5ML SOAJ Inject 0.25 mg into the skin once a week. Patient not taking: Reported on 02/03/2022 02/02/22     tiZANidine  (ZANAFLEX ) 4 MG tablet Take 1 tablet (4 mg total) by mouth every 8 (eight) hours as needed for muscle spasms. 01/22/22   Ganta, Anupa, DO    Allergies: Patient has no known allergies.    Review of Systems  Constitutional:  Negative for chills and fever.  Respiratory:  Positive for cough, shortness of breath and wheezing.   Cardiovascular:  Negative for chest pain.  Neurological:  Negative for light-headedness.  All other systems reviewed and are negative.   Updated Vital Signs BP (!) 121/91 (BP Location: Right Arm)   Pulse 98   Temp 98.4 F (36.9 C) (Oral)   Resp 20   SpO2 97%   Physical Exam Vitals and nursing note reviewed.  Constitutional:      General: She is not in acute distress.    Appearance: Normal appearance. She is not ill-appearing.  HENT:     Head: Normocephalic and atraumatic.     Nose: Nose normal.  Eyes:     Conjunctiva/sclera: Conjunctivae normal.  Cardiovascular:     Rate and Rhythm: Normal rate and regular rhythm.  Pulmonary:     Effort: Pulmonary effort is normal. No respiratory distress.     Breath sounds:  Normal breath sounds. No wheezing or rales.  Chest:     Chest wall: No tenderness.  Musculoskeletal:        General: No deformity. Normal range of motion.     Cervical back: Normal range of motion.  Skin:    Findings: No rash.  Neurological:     Mental Status: She is alert.     (all labs ordered are listed, but only abnormal results are displayed) Labs Reviewed  RESP PANEL BY RT-PCR (RSV, FLU A&B, COVID)  [id number] - Abnormal; Notable for the following components:      Result Value   Influenza A by PCR POSITIVE (*)    All other components within normal limits     EKG: None  Radiology: No results found.   Procedures   Medications Ordered in the ED - No data to display                                  Medical Decision Making  53 year old female presents today for concern of cough, body ache, shortness of breath.  Endorses wheezing.  History of asthma.  No prior history of intubation secondary to asthma.  Using nebulizer and albuterol  inhaler. Lung sounds are clear on exam. No acute distress. Satting well on room air Will provide Solu-Medrol  dose and prescribe short course of prednisone  Discharged in stable condition. Outside window of Tamiflu. No respiratory distress.  Without productive cough.  No indication for chest x-ray.   Final diagnoses:  Mild asthma with exacerbation, unspecified whether persistent  Influenza A    ED Discharge Orders          Ordered    predniSONE  (DELTASONE ) 20 MG tablet  Daily with breakfast        01/21/24 2336               Hildegard Loge, PA-C 01/21/24 2341  "

## 2024-01-22 MED ORDER — DEXAMETHASONE SOD PHOSPHATE PF 10 MG/ML IJ SOLN
10.0000 mg | Freq: Once | INTRAMUSCULAR | Status: AC
  Administered 2024-01-22: 10 mg via INTRAMUSCULAR
